# Patient Record
Sex: Male | Born: 1945 | Race: Black or African American | Hispanic: No | Marital: Single | Smoking: Former smoker
Health system: Southern US, Community
[De-identification: ages and names within clinical notes are randomized; demographics above are authoritative.]

## PROBLEM LIST (undated history)

## (undated) DIAGNOSIS — R7989 Other specified abnormal findings of blood chemistry: Secondary | ICD-10-CM

## (undated) DIAGNOSIS — R945 Abnormal results of liver function studies: Secondary | ICD-10-CM

## (undated) DIAGNOSIS — N529 Male erectile dysfunction, unspecified: Secondary | ICD-10-CM

## (undated) DIAGNOSIS — R011 Cardiac murmur, unspecified: Secondary | ICD-10-CM

## (undated) DIAGNOSIS — I10 Essential (primary) hypertension: Secondary | ICD-10-CM

## (undated) DIAGNOSIS — R7303 Prediabetes: Secondary | ICD-10-CM

## (undated) DIAGNOSIS — H9190 Unspecified hearing loss, unspecified ear: Secondary | ICD-10-CM

## (undated) DIAGNOSIS — H9193 Unspecified hearing loss, bilateral: Secondary | ICD-10-CM

## (undated) DIAGNOSIS — Z720 Tobacco use: Secondary | ICD-10-CM

## (undated) DIAGNOSIS — M1990 Unspecified osteoarthritis, unspecified site: Secondary | ICD-10-CM

## (undated) DIAGNOSIS — B182 Chronic viral hepatitis C: Secondary | ICD-10-CM

## (undated) HISTORY — DX: Cardiac murmur, unspecified: R01.1

## (undated) HISTORY — DX: Unspecified hearing loss, bilateral: H91.93

## (undated) HISTORY — DX: Abnormal results of liver function studies: R94.5

## (undated) HISTORY — DX: Prediabetes: R73.03

## (undated) HISTORY — DX: Male erectile dysfunction, unspecified: N52.9

## (undated) HISTORY — DX: Other specified abnormal findings of blood chemistry: R79.89

## (undated) HISTORY — PX: MANDIBLE SURGERY: SHX707

## (undated) HISTORY — DX: Unspecified hearing loss, unspecified ear: H91.90

## (undated) HISTORY — DX: Tobacco use: Z72.0

## (undated) HISTORY — DX: Chronic viral hepatitis C: B18.2

## (undated) HISTORY — DX: Unspecified osteoarthritis, unspecified site: M19.90

## (undated) HISTORY — DX: Essential (primary) hypertension: I10

---

## 2009-08-10 DEATH — deceased

## 2013-04-22 DIAGNOSIS — M064 Inflammatory polyarthropathy: Secondary | ICD-10-CM | POA: Diagnosis not present

## 2013-04-22 DIAGNOSIS — N529 Male erectile dysfunction, unspecified: Secondary | ICD-10-CM | POA: Diagnosis not present

## 2013-04-22 DIAGNOSIS — F17200 Nicotine dependence, unspecified, uncomplicated: Secondary | ICD-10-CM | POA: Diagnosis not present

## 2013-04-22 DIAGNOSIS — Z7189 Other specified counseling: Secondary | ICD-10-CM | POA: Diagnosis not present

## 2013-05-18 DIAGNOSIS — N529 Male erectile dysfunction, unspecified: Secondary | ICD-10-CM | POA: Diagnosis not present

## 2013-05-18 DIAGNOSIS — M064 Inflammatory polyarthropathy: Secondary | ICD-10-CM | POA: Diagnosis not present

## 2013-05-18 DIAGNOSIS — Z7189 Other specified counseling: Secondary | ICD-10-CM | POA: Diagnosis not present

## 2013-05-25 DIAGNOSIS — I10 Essential (primary) hypertension: Secondary | ICD-10-CM | POA: Diagnosis not present

## 2013-05-25 DIAGNOSIS — R7989 Other specified abnormal findings of blood chemistry: Secondary | ICD-10-CM | POA: Diagnosis not present

## 2013-05-25 DIAGNOSIS — R7309 Other abnormal glucose: Secondary | ICD-10-CM | POA: Diagnosis not present

## 2013-05-25 DIAGNOSIS — F17200 Nicotine dependence, unspecified, uncomplicated: Secondary | ICD-10-CM | POA: Diagnosis not present

## 2013-06-24 DIAGNOSIS — Z23 Encounter for immunization: Secondary | ICD-10-CM | POA: Diagnosis not present

## 2013-06-24 DIAGNOSIS — R7989 Other specified abnormal findings of blood chemistry: Secondary | ICD-10-CM | POA: Diagnosis not present

## 2013-06-24 DIAGNOSIS — I10 Essential (primary) hypertension: Secondary | ICD-10-CM | POA: Diagnosis not present

## 2013-06-24 DIAGNOSIS — E875 Hyperkalemia: Secondary | ICD-10-CM | POA: Diagnosis not present

## 2013-07-16 DIAGNOSIS — E875 Hyperkalemia: Secondary | ICD-10-CM | POA: Diagnosis not present

## 2013-07-16 DIAGNOSIS — R7989 Other specified abnormal findings of blood chemistry: Secondary | ICD-10-CM | POA: Diagnosis not present

## 2013-08-13 DIAGNOSIS — B182 Chronic viral hepatitis C: Secondary | ICD-10-CM | POA: Diagnosis not present

## 2013-09-07 DIAGNOSIS — B182 Chronic viral hepatitis C: Secondary | ICD-10-CM | POA: Diagnosis not present

## 2014-01-14 ENCOUNTER — Emergency Department: Payer: Self-pay | Admitting: Emergency Medicine

## 2014-01-14 DIAGNOSIS — B1920 Unspecified viral hepatitis C without hepatic coma: Secondary | ICD-10-CM | POA: Diagnosis not present

## 2014-01-14 DIAGNOSIS — Z79899 Other long term (current) drug therapy: Secondary | ICD-10-CM | POA: Diagnosis not present

## 2014-01-14 DIAGNOSIS — F17200 Nicotine dependence, unspecified, uncomplicated: Secondary | ICD-10-CM | POA: Diagnosis not present

## 2014-01-14 DIAGNOSIS — J42 Unspecified chronic bronchitis: Secondary | ICD-10-CM | POA: Diagnosis not present

## 2014-01-14 DIAGNOSIS — J438 Other emphysema: Secondary | ICD-10-CM | POA: Diagnosis not present

## 2014-01-14 DIAGNOSIS — J209 Acute bronchitis, unspecified: Secondary | ICD-10-CM | POA: Diagnosis not present

## 2014-01-14 DIAGNOSIS — I10 Essential (primary) hypertension: Secondary | ICD-10-CM | POA: Diagnosis not present

## 2014-01-14 LAB — COMPREHENSIVE METABOLIC PANEL
ALBUMIN: 3.2 g/dL — AB (ref 3.4–5.0)
ALK PHOS: 95 U/L
ALT: 185 U/L — AB (ref 12–78)
AST: 221 U/L — AB (ref 15–37)
Anion Gap: 3 — ABNORMAL LOW (ref 7–16)
BILIRUBIN TOTAL: 0.4 mg/dL (ref 0.2–1.0)
BUN: 8 mg/dL (ref 7–18)
CALCIUM: 8.7 mg/dL (ref 8.5–10.1)
CREATININE: 0.82 mg/dL (ref 0.60–1.30)
Chloride: 106 mmol/L (ref 98–107)
Co2: 30 mmol/L (ref 21–32)
EGFR (African American): >60
EGFR (Non-African Amer.): >60
GLUCOSE: 88 mg/dL (ref 65–99)
OSMOLALITY: 275 (ref 275–301)
POTASSIUM: 4.5 mmol/L (ref 3.5–5.1)
SODIUM: 139 mmol/L (ref 136–145)
TOTAL PROTEIN: 8.8 g/dL — AB (ref 6.4–8.2)

## 2014-01-14 LAB — CBC
HCT: 45.8 % (ref 40.0–52.0)
HGB: 15 g/dL (ref 13.0–18.0)
MCH: 30 pg (ref 26.0–34.0)
MCHC: 32.6 g/dL (ref 32.0–36.0)
MCV: 92 fL (ref 80–100)
Platelet: 152 10*3/uL (ref 150–440)
RBC: 4.99 10*6/uL (ref 4.40–5.90)
RDW: 17.8 % — ABNORMAL HIGH (ref 11.5–14.5)
WBC: 7 10*3/uL (ref 3.8–10.6)

## 2014-11-12 DIAGNOSIS — Z9181 History of falling: Secondary | ICD-10-CM | POA: Diagnosis not present

## 2014-11-12 DIAGNOSIS — R011 Cardiac murmur, unspecified: Secondary | ICD-10-CM | POA: Diagnosis not present

## 2014-11-12 DIAGNOSIS — B182 Chronic viral hepatitis C: Secondary | ICD-10-CM | POA: Diagnosis not present

## 2014-11-12 DIAGNOSIS — M1990 Unspecified osteoarthritis, unspecified site: Secondary | ICD-10-CM | POA: Diagnosis not present

## 2014-11-12 DIAGNOSIS — E875 Hyperkalemia: Secondary | ICD-10-CM | POA: Diagnosis not present

## 2014-11-12 DIAGNOSIS — H9193 Unspecified hearing loss, bilateral: Secondary | ICD-10-CM | POA: Diagnosis not present

## 2014-11-12 DIAGNOSIS — I10 Essential (primary) hypertension: Secondary | ICD-10-CM | POA: Diagnosis not present

## 2014-11-12 DIAGNOSIS — R6889 Other general symptoms and signs: Secondary | ICD-10-CM | POA: Diagnosis not present

## 2014-11-23 ENCOUNTER — Other Ambulatory Visit: Payer: Self-pay | Admitting: Family Medicine

## 2014-11-23 DIAGNOSIS — E875 Hyperkalemia: Secondary | ICD-10-CM | POA: Diagnosis not present

## 2014-12-15 ENCOUNTER — Ambulatory Visit
Admit: 2014-12-15 | Disposition: A | Discharge status: Home / Self Care | Payer: Self-pay | Attending: Family Medicine | Admitting: Family Medicine

## 2014-12-15 DIAGNOSIS — R05 Cough: Secondary | ICD-10-CM | POA: Diagnosis not present

## 2014-12-15 DIAGNOSIS — R011 Cardiac murmur, unspecified: Secondary | ICD-10-CM | POA: Diagnosis not present

## 2014-12-15 DIAGNOSIS — I10 Essential (primary) hypertension: Secondary | ICD-10-CM | POA: Diagnosis not present

## 2014-12-15 DIAGNOSIS — B182 Chronic viral hepatitis C: Secondary | ICD-10-CM | POA: Diagnosis not present

## 2014-12-22 ENCOUNTER — Ambulatory Visit
Admit: 2014-12-22 | Disposition: A | Discharge status: Home / Self Care | Payer: Self-pay | Attending: Gastroenterology | Admitting: Gastroenterology

## 2014-12-22 DIAGNOSIS — B182 Chronic viral hepatitis C: Secondary | ICD-10-CM | POA: Diagnosis not present

## 2014-12-22 LAB — CBC WITH DIFFERENTIAL/PLATELET
Basophil #: 0.1 10*3/uL (ref 0.0–0.1)
Basophil %: 1 %
EOS ABS: 0.1 10*3/uL (ref 0.0–0.7)
EOS PCT: 2.2 %
HCT: 49.4 % (ref 40.0–52.0)
HGB: 16.3 g/dL (ref 13.0–18.0)
LYMPHS ABS: 2.3 10*3/uL (ref 1.0–3.6)
Lymphocyte %: 35.3 %
MCH: 29.9 pg (ref 26.0–34.0)
MCHC: 32.9 g/dL (ref 32.0–36.0)
MCV: 91 fL (ref 80–100)
MONO ABS: 0.8 x10 3/mm (ref 0.2–1.0)
Monocyte %: 12.1 %
NEUTROS PCT: 49.4 %
Neutrophil #: 3.2 10*3/uL (ref 1.4–6.5)
PLATELETS: 152 10*3/uL (ref 150–440)
RBC: 5.44 10*6/uL (ref 4.40–5.90)
RDW: 17.3 % — ABNORMAL HIGH (ref 11.5–14.5)
WBC: 6.6 10*3/uL (ref 3.8–10.6)

## 2014-12-22 LAB — HEPATIC FUNCTION PANEL A (ARMC)
Albumin: 4 g/dL
Alkaline Phosphatase: 93 U/L
Bilirubin, Direct: 0.3 mg/dL
Bilirubin,Total: 1.1 mg/dL
Indirect Bilirubin: 0.8
SGOT(AST): 163 U/L — ABNORMAL HIGH
SGPT (ALT): 166 U/L — ABNORMAL HIGH
Total Protein: 9.3 g/dL — ABNORMAL HIGH

## 2014-12-28 ENCOUNTER — Ambulatory Visit
Admit: 2014-12-28 | Disposition: A | Discharge status: Home / Self Care | Payer: Self-pay | Attending: Gastroenterology | Admitting: Gastroenterology

## 2014-12-28 DIAGNOSIS — B182 Chronic viral hepatitis C: Secondary | ICD-10-CM | POA: Diagnosis not present

## 2014-12-28 DIAGNOSIS — B1920 Unspecified viral hepatitis C without hepatic coma: Secondary | ICD-10-CM | POA: Diagnosis not present

## 2014-12-28 DIAGNOSIS — R932 Abnormal findings on diagnostic imaging of liver and biliary tract: Secondary | ICD-10-CM | POA: Diagnosis not present

## 2015-01-18 DIAGNOSIS — R7309 Other abnormal glucose: Secondary | ICD-10-CM | POA: Diagnosis not present

## 2015-01-18 DIAGNOSIS — I10 Essential (primary) hypertension: Secondary | ICD-10-CM | POA: Diagnosis not present

## 2015-01-18 DIAGNOSIS — Z1211 Encounter for screening for malignant neoplasm of colon: Secondary | ICD-10-CM | POA: Diagnosis not present

## 2015-01-18 DIAGNOSIS — R011 Cardiac murmur, unspecified: Secondary | ICD-10-CM | POA: Diagnosis not present

## 2015-01-18 DIAGNOSIS — Z720 Tobacco use: Secondary | ICD-10-CM | POA: Diagnosis not present

## 2015-01-18 DIAGNOSIS — R202 Paresthesia of skin: Secondary | ICD-10-CM | POA: Diagnosis not present

## 2015-01-18 DIAGNOSIS — B182 Chronic viral hepatitis C: Secondary | ICD-10-CM | POA: Diagnosis not present

## 2015-01-18 DIAGNOSIS — Z9181 History of falling: Secondary | ICD-10-CM | POA: Diagnosis not present

## 2015-01-20 ENCOUNTER — Other Ambulatory Visit
Admission: RE | Admit: 2015-01-20 | Discharge: 2015-01-20 | Disposition: A | Discharge status: Home / Self Care | Payer: Medicare Other | Source: Ambulatory Visit | Attending: Family Medicine | Admitting: Family Medicine

## 2015-01-20 DIAGNOSIS — R202 Paresthesia of skin: Secondary | ICD-10-CM | POA: Diagnosis not present

## 2015-01-20 DIAGNOSIS — R200 Anesthesia of skin: Secondary | ICD-10-CM | POA: Insufficient documentation

## 2015-01-20 LAB — FOLATE: Folate: 12.6 ng/mL (ref >5.9)

## 2015-01-20 LAB — TSH: TSH: 1.445 u[IU]/mL (ref 0.350–4.500)

## 2015-01-20 LAB — VITAMIN B12: Vitamin B-12: 421 pg/mL (ref 180–914)

## 2015-02-16 ENCOUNTER — Encounter: Payer: Self-pay | Admitting: *Deleted

## 2015-02-16 ENCOUNTER — Encounter: Payer: Self-pay | Admitting: Cardiology

## 2015-02-16 NOTE — Progress Notes (Deleted)
   PATIENT: Roy Roberson MRN: [id number] DOB: 03/21/1946 PCP: Fidel LevyJames Hawkins Jr, MD  Clinic Note: No chief complaint on file.   HPI: Roy Roberson is a 69 y.o. male with a PMH below who presents today for ***.  Interval History: ***  The remainder of her Cardiovascular ROS is as follows: {roscv:310661} :   Past Medical History  Diagnosis Date  . Hypertension   . Decreased hearing of both ears   . Inflammatory arthritis   . Prediabetes   . Tobacco abuse   . Cardiac murmur   . ED (erectile dysfunction)   . Elevated liver function tests   . Chronic hepatitis C without hepatic coma     Prior Cardiac Evaluation and Past Surgical History: Past Surgical History  Procedure Laterality Date  . Mandible surgery      No Known Allergies  Current Outpatient Prescriptions  Medication Sig Dispense Refill  . hydrochlorothiazide (MICROZIDE) 12.5 MG capsule Take 12.5 mg by mouth daily.    . Ledipasvir-Sofosbuvir 90-400 MG TABS Take by mouth daily.    Marland Kitchen. losartan (COZAAR) 50 MG tablet Take 50 mg by mouth daily.     No current facility-administered medications for this visit.    History  Substance Use Topics  . Smoking status: Current Every Day Smoker -- 1.00 packs/day for 30 years  . Smokeless tobacco: Not on file     Comment: currently smokes 1/2 PPD.   Marland Kitchen. Alcohol Use: Yes     Comment: 12 pack over the weekend.      family history is not on file.  ROS: A comprehensive Review of Systems -  ROS   PHYSICAL EXAM There were no vitals taken for this visit. {Physical WUJW:1191478}Exam:[id number]}   Adult ECG Report  Rate: *** ;  Rhythm: {rhythm:17366}  QRS Axis: *** ;  PR Interval: *** ;  QRS Duration: *** ; QTc: ***  Voltages: ***  Conduction Disturbances: {conduction defects:17367}  Other Abnormalities: {findings; ecg:17365}   Narrative Interpretation: ***  Recent Labs: ***  ASSESSMENT / PLAN: ***  Problem List Items Addressed This Visit    None      No orders of the  defined types were placed in this encounter.    Followup: *** months  DAVID W. Herbie BaltimoreHARDING, M.D., M.S. Interventional Cardiolgy [facility]    This encounter was created in error - please disregard.

## 2015-02-18 NOTE — Progress Notes (Signed)
No Show

## 2015-04-04 ENCOUNTER — Encounter: Payer: Self-pay | Admitting: Gastroenterology

## 2015-04-04 ENCOUNTER — Telehealth: Payer: Self-pay | Admitting: Gastroenterology

## 2015-04-04 NOTE — Telephone Encounter (Signed)
Patient called and said he has 4 Harvoni pills left. Is he cured or should he get more medication? Please call. Patient said he does not have an answering machine but a red light will blink when he has a phone call.

## 2015-04-04 NOTE — Telephone Encounter (Signed)
Advised pt that Dr. Servando Snare requires an office visit once he completes his treatment. Told pt we will order his labs during this visit. Pt scheduled with Dr. Servando Snare on 05-04-15 in [place].

## 2015-04-15 ENCOUNTER — Ambulatory Visit (INDEPENDENT_AMBULATORY_CARE_PROVIDER_SITE_OTHER): Payer: Medicare Other | Admitting: Family Medicine

## 2015-04-15 ENCOUNTER — Encounter: Payer: Self-pay | Admitting: Family Medicine

## 2015-04-15 VITALS — BP 120/60 | HR 84 | Temp 97.5°F | Resp 16 | Ht 68.0 in | Wt 179.4 lb

## 2015-04-15 DIAGNOSIS — B182 Chronic viral hepatitis C: Secondary | ICD-10-CM | POA: Diagnosis not present

## 2015-04-15 DIAGNOSIS — R011 Cardiac murmur, unspecified: Secondary | ICD-10-CM | POA: Diagnosis not present

## 2015-04-15 DIAGNOSIS — R053 Chronic cough: Secondary | ICD-10-CM | POA: Insufficient documentation

## 2015-04-15 DIAGNOSIS — F17210 Nicotine dependence, cigarettes, uncomplicated: Secondary | ICD-10-CM | POA: Diagnosis not present

## 2015-04-15 DIAGNOSIS — I10 Essential (primary) hypertension: Secondary | ICD-10-CM | POA: Diagnosis not present

## 2015-04-15 DIAGNOSIS — R945 Abnormal results of liver function studies: Secondary | ICD-10-CM | POA: Insufficient documentation

## 2015-04-15 DIAGNOSIS — R05 Cough: Secondary | ICD-10-CM | POA: Insufficient documentation

## 2015-04-15 DIAGNOSIS — E875 Hyperkalemia: Secondary | ICD-10-CM | POA: Insufficient documentation

## 2015-04-15 DIAGNOSIS — R7989 Other specified abnormal findings of blood chemistry: Secondary | ICD-10-CM | POA: Insufficient documentation

## 2015-04-15 DIAGNOSIS — H9190 Unspecified hearing loss, unspecified ear: Secondary | ICD-10-CM | POA: Insufficient documentation

## 2015-04-15 DIAGNOSIS — R202 Paresthesia of skin: Secondary | ICD-10-CM | POA: Insufficient documentation

## 2015-04-15 DIAGNOSIS — Z1211 Encounter for screening for malignant neoplasm of colon: Secondary | ICD-10-CM | POA: Insufficient documentation

## 2015-04-15 DIAGNOSIS — N529 Male erectile dysfunction, unspecified: Secondary | ICD-10-CM | POA: Insufficient documentation

## 2015-04-15 DIAGNOSIS — Z709 Sex counseling, unspecified: Secondary | ICD-10-CM | POA: Insufficient documentation

## 2015-04-15 DIAGNOSIS — Z8619 Personal history of other infectious and parasitic diseases: Secondary | ICD-10-CM | POA: Insufficient documentation

## 2015-04-15 DIAGNOSIS — M180 Bilateral primary osteoarthritis of first carpometacarpal joints: Secondary | ICD-10-CM | POA: Insufficient documentation

## 2015-04-15 NOTE — Patient Instructions (Signed)
Continue to f/u with Dr. Servando Snare re: Hep C.  Continue current medication.  Appt. For Cardiology in 1-2 months.

## 2015-04-15 NOTE — Progress Notes (Signed)
Name: Roy Roberson   MRN: [id number]    DOB: Oct 15, 1945   Date:04/15/2015       Progress Note  Subjective  Chief Complaint  Chief Complaint  Patient presents with  . Hypertension    HPI  Here for f/u of HBP.  Still smoking.  Has been treated for chronic Hepatitis C.  Taking 2 BP meds.  Feeling well. No problem-specific assessment & plan notes found for this encounter.   Past Medical History  Diagnosis Date  . Hypertension   . Decreased hearing of both ears   . Inflammatory arthritis   . Prediabetes   . Tobacco abuse   . Cardiac murmur   . ED (erectile dysfunction)   . Elevated liver function tests   . Chronic hepatitis C without hepatic coma   . Decreased hearing     History  Substance Use Topics  . Smoking status: Current Every Day Smoker -- 1.00 packs/day for 30 years  . Smokeless tobacco: Never Used     Comment: currently smokes 1/2 PPD.   Marland Kitchen Alcohol Use: Yes     Comment: 12 pack over the weekend.      Current outpatient prescriptions:  .  hydrochlorothiazide (MICROZIDE) 12.5 MG capsule, Take 12.5 mg by mouth daily., Disp: , Rfl:  .  losartan (COZAAR) 50 MG tablet, Take 50 mg by mouth daily., Disp: , Rfl:   No Known Allergies  Review of Systems  Constitutional: Negative for fever, chills, weight loss and malaise/fatigue.  HENT: Negative for hearing loss.   Eyes: Negative for blurred vision and double vision.  Respiratory: Negative for cough, sputum production, shortness of breath and wheezing.   Cardiovascular: Negative for chest pain, palpitations, orthopnea and leg swelling.  Gastrointestinal: Negative for heartburn, nausea, vomiting, abdominal pain, diarrhea and blood in stool.  Genitourinary: Negative for dysuria, urgency and frequency.  Musculoskeletal: Negative for myalgias and joint pain.  Skin: Negative for rash.  Neurological: Negative for dizziness, tingling, sensory change, focal weakness, weakness and headaches.      Objective  Filed  Vitals:   04/15/15 1038  BP: 100/63  Pulse: 84  Temp: 97.5 F (36.4 C)  Resp: 16  Height:  (1.727 m)  Weight: 179 lb 6.4 oz (81.375 kg)     Physical Exam  Constitutional: He is well-developed, well-nourished, and in no distress.  HENT:  Head: Normocephalic and atraumatic.  Eyes: Conjunctivae and EOM are normal. Pupils are equal, round, and reactive to light.  Neck: Normal range of motion. Neck supple. No thyromegaly present.  Cardiovascular: Normal rate and regular rhythm.  Exam reveals no gallop and no friction rub.   Murmur heard.  Systolic murmur is present with a grade of 3/6  Murmur heard throughout.  Pulmonary/Chest: Effort normal. No respiratory distress. He has decreased breath sounds in the right upper field, the right middle field, the right lower field, the left upper field, the left middle field and the left lower field. He has no wheezes. He has no rales.  Abdominal: Soft. Bowel sounds are normal. He exhibits no distension and no mass. There is no tenderness.  Musculoskeletal: He exhibits no edema.  Lymphadenopathy:    He has no cervical adenopathy.  Vitals reviewed.     Recent Results (from the past 2160 hour(s))  Vitamin B12     Status: None   Collection Time: 01/20/15  9:38 AM  Result Value Ref Range   Vitamin B-12 421 180 - 914 pg/mL  Comment: (NOTE) This assay is not validated for testing neonatal or myeloproliferative syndrome specimens for Vitamin B12 levels. Performed at [facility]   Folate     Status: None   Collection Time: 01/20/15  9:38 AM  Result Value Ref Range   Folate 12.6 >5.9 ng/mL  TSH     Status: None   Collection Time: 01/20/15  9:38 AM  Result Value Ref Range   TSH 1.445 0.350 - 4.500 uIU/mL     Assessment & Plan  1. Smoking 1/2 pack a day or less    2. Essential (primary) hypertension    3. Chronic hepatitis C without hepatic coma    4. Cardiac murmur  - Ambulatory referral to  Cardiology

## 2015-04-21 ENCOUNTER — Encounter: Payer: Self-pay | Admitting: *Deleted

## 2015-05-04 ENCOUNTER — Other Ambulatory Visit
Admission: RE | Admit: 2015-05-04 | Discharge: 2015-05-04 | Disposition: A | Discharge status: Home / Self Care | Payer: Medicare Other | Source: Ambulatory Visit | Attending: Gastroenterology | Admitting: Gastroenterology

## 2015-05-04 ENCOUNTER — Ambulatory Visit (INDEPENDENT_AMBULATORY_CARE_PROVIDER_SITE_OTHER): Payer: Medicare Other | Admitting: Gastroenterology

## 2015-05-04 ENCOUNTER — Encounter: Payer: Self-pay | Admitting: Gastroenterology

## 2015-05-04 VITALS — BP 105/66 | HR 83 | Temp 98.7°F | Ht 66.0 in | Wt 180.0 lb

## 2015-05-04 DIAGNOSIS — B182 Chronic viral hepatitis C: Secondary | ICD-10-CM

## 2015-05-04 LAB — HEPATIC FUNCTION PANEL
ALT: 27 U/L (ref 17–63)
AST: 35 U/L (ref 15–41)
Albumin: 3.8 g/dL (ref 3.5–5.0)
Alkaline Phosphatase: 73 U/L (ref 38–126)
Total Bilirubin: 0.4 mg/dL (ref 0.3–1.2)
Total Protein: 8.8 g/dL — ABNORMAL HIGH (ref 6.5–8.1)

## 2015-05-04 NOTE — Progress Notes (Signed)
   Primary Care Physician: Fidel Levy, MD  Primary Gastroenterologist:  Dr. Midge Minium  Chief Complaint  Patient presents with  . Follow up Hepatitis C    Treatment completed    HPI: Roy Roberson is a 69 y.o. male here for follow-up of his hepatitis C. The patient reports that he completed his treatment about a month ago. He reports that he did not have any side effects from the treatment. He has been doing well since stopping the medication. His fibrosis score was F0/F1 on imaging.    Current Outpatient Prescriptions  Medication Sig Dispense Refill  . hydrochlorothiazide (MICROZIDE) 12.5 MG capsule Take 12.5 mg by mouth daily.    Marland Kitchen losartan (COZAAR) 50 MG tablet Take 50 mg by mouth daily.     No current facility-administered medications for this visit.    Allergies as of 05/04/2015  . (No Known Allergies)    ROS:  General: Negative for anorexia, weight loss, fever, chills, fatigue, weakness. ENT: Negative for hoarseness, difficulty swallowing , nasal congestion. CV: Negative for chest pain, angina, palpitations, dyspnea on exertion, peripheral edema.  Respiratory: Negative for dyspnea at rest, dyspnea on exertion, cough, sputum, wheezing.  GI: See history of present illness. GU:  Negative for dysuria, hematuria, urinary incontinence, urinary frequency, nocturnal urination.  Endo: Negative for unusual weight change.    Physical Examination:   BP 105/66 mmHg  Pulse 83  Temp(Src) 98.7 F (37.1 C) (Oral)  Ht  (1.676 m)  Wt 180 lb (81.647 kg)  BMI 29.07 kg/m2  General: Well-nourished, well-developed in no acute distress.  Eyes: No icterus. Conjunctivae pink. Mouth: Oropharyngeal mucosa moist and pink , no lesions erythema or exudate. Lungs: Clear to auscultation bilaterally. Non-labored. Heart: Regular rate and rhythm, no murmurs rubs or gallops.  Abdomen: Bowel sounds are normal, nontender, nondistended, no hepatosplenomegaly or masses, no abdominal  bruits or hernia , no rebound or guarding.   Extremities: No lower extremity edema. No clubbing or deformities. Neuro: Alert and oriented x 3.  Grossly intact. Skin: Warm and dry, no jaundice.   Psych: Alert and cooperative, normal mood and affect.  Labs:    Imaging Studies: No results found.  Assessment and Plan:   Roy Roberson is a 69 y.o. y/o male who comes for follow-up after completing his hepatitis C treatment. The patient will have his last sent off for hepatitis C viral load and for the refraction test. The patient will be contacted with the results.   Note: This dictation was prepared with Dragon dictation along with smaller phrase technology. Any transcriptional errors that result from this process are unintentional.

## 2015-05-05 LAB — HCV RNA QUANT RFLX ULTRA OR GENOTYP
HCV RNA QNT(LOG COPY/ML): UNDETERMINED {Log_IU}/mL
HEPATITIS C QUANTITATION: NOT DETECTED [IU]/mL

## 2015-05-09 ENCOUNTER — Telehealth: Payer: Self-pay

## 2015-05-09 NOTE — Telephone Encounter (Signed)
LVM for pt to return my call.

## 2015-05-09 NOTE — Telephone Encounter (Signed)
-----   Message from Darren Wohl, MD sent at 05/09/2015  1:05 PM EDT ----- Let the patient know that his liver enzymes are normal and his hepatitis C count was 0. Repeat labs that 6 months and one year after he completed his treatment. 

## 2015-05-10 NOTE — Telephone Encounter (Signed)
-----   Message from Midge Minium, MD sent at 05/09/2015  1:05 PM EDT ----- Let the patient know that his liver enzymes are normal and his hepatitis C count was 0. Repeat labs that 6 months and one year after he completed his treatment.

## 2015-05-10 NOTE — Telephone Encounter (Signed)
Pt notified of lab results. Pt added to recall list for 6 months repeat of viral load.

## 2015-05-31 NOTE — Telephone Encounter (Signed)
error 

## 2015-06-15 ENCOUNTER — Encounter: Payer: Self-pay | Admitting: Cardiovascular Disease

## 2015-06-15 ENCOUNTER — Ambulatory Visit (INDEPENDENT_AMBULATORY_CARE_PROVIDER_SITE_OTHER): Payer: Medicare Other | Admitting: Cardiovascular Disease

## 2015-06-15 VITALS — BP 110/62 | HR 86 | Ht 64.0 in | Wt 181.5 lb

## 2015-06-15 DIAGNOSIS — F1010 Alcohol abuse, uncomplicated: Secondary | ICD-10-CM

## 2015-06-15 DIAGNOSIS — I10 Essential (primary) hypertension: Secondary | ICD-10-CM

## 2015-06-15 DIAGNOSIS — B182 Chronic viral hepatitis C: Secondary | ICD-10-CM

## 2015-06-15 DIAGNOSIS — R011 Cardiac murmur, unspecified: Secondary | ICD-10-CM | POA: Diagnosis not present

## 2015-06-15 NOTE — Assessment & Plan Note (Signed)
Very prominent murmur diffusely throughout his axillary region on the left to right upper sternal border even up to his carotids. Most notable across the left of the sternum concerning for possible VSD though unable to exclude tricuspid valve regurgitation or other etiology. Certainly needs an echocardiogram to determine the etiology of his murmur. One will be ordered for him. This was discussed with him and he is in agreement

## 2015-06-15 NOTE — Assessment & Plan Note (Signed)
Blood pressure is well controlled on today's visit. No changes made to the medications. 

## 2015-06-15 NOTE — Assessment & Plan Note (Signed)
Managed by Dr. Servando Snare. Notes indicate he has had treatment

## 2015-06-15 NOTE — Progress Notes (Signed)
Patient ID: Roy Roberson, male    DOB: [birth year], 69 y.o.   MRN: [id number]  HPI Comments: Mr. Brandel is a 69 year old gentleman with long smoking history who continues to smoke 5 cigarettes per day, hepatitis C who has completed treatment under the guidance of Dr. Servando Snare, long alcohol history who presents for evaluation of murmur.  He denies any shortness of breath with exertion, no lower extremity edema, no PND or orthopnea. In general he feels well. He used to bike "because he had to" but does not bike as much anymore. He does not drive, relies on family members to write him to his appointments. He's never been told that he has a murmur before until recently Dr. Juanetta Gosling. Denies any chest pain on exertion. Reports that he did quit smoking for a period of time, but had a girlfriend that got him started again.  Recent neck pain, difficulty turning his head. Started taking more alcohol and this seemed to help his symptoms.  EKG on today's visit shows normal sinus rhythm with rate 86 bpm, no significant ST or T-wave changes    No Known Allergies  Current Outpatient Prescriptions on File Prior to Visit  Medication Sig Dispense Refill  . hydrochlorothiazide (MICROZIDE) 12.5 MG capsule Take 12.5 mg by mouth daily.    Marland Kitchen losartan (COZAAR) 50 MG tablet Take 50 mg by mouth daily.     No current facility-administered medications on file prior to visit.    Past Medical History  Diagnosis Date  . Hypertension   . Decreased hearing of both ears   . Inflammatory arthritis (HCC)   . Prediabetes   . Tobacco abuse   . Cardiac murmur   . ED (erectile dysfunction)   . Elevated liver function tests   . Chronic hepatitis C without hepatic coma (HCC)   . Decreased hearing     Past Surgical History  Procedure Laterality Date  . Mandible surgery      Social History  reports that he has been smoking.  He has never used smokeless tobacco. He reports that he drinks alcohol. He reports that he  does not use illicit drugs.  Family History family history includes Cancer in his mother; Diabetes in his mother.  Review of Systems  Constitutional: Negative.   Respiratory: Negative.   Cardiovascular: Negative.   Gastrointestinal: Negative.   Musculoskeletal: Negative.   Neurological: Negative.   Hematological: Negative.   Psychiatric/Behavioral: Negative.   All other systems reviewed and are negative.  BP 110/62 mmHg  Pulse 86  Ht  (1.626 m)  Wt 181 lb 8 oz (82.328 kg)  BMI 31.14 kg/m2  Physical Exam  Constitutional: He is oriented to person, place, and time. He appears well-developed and well-nourished.  HENT:  Head: Normocephalic.  Nose: Nose normal.  Mouth/Throat: Oropharynx is clear and moist.  Eyes: Conjunctivae are normal. Pupils are equal, round, and reactive to light.  Neck: Normal range of motion. Neck supple. No JVD present.  Cardiovascular: Normal rate, regular rhythm and intact distal pulses.  Exam reveals no gallop and no friction rub.   Murmur heard.  Systolic murmur is present with a grade of 2/6  Murmur appreciated throughout his left chest most notably left of the sternum at the third and fourth intercostal but radiating up to his carotids, into the left axilla.  Pulmonary/Chest: Effort normal and breath sounds normal. No respiratory distress. He has no wheezes. He has no rales. He exhibits no tenderness.  Abdominal: Soft. Bowel  sounds are normal. He exhibits no distension. There is no tenderness.  Musculoskeletal: Normal range of motion. He exhibits no edema or tenderness.  Lymphadenopathy:    He has no cervical adenopathy.  Neurological: He is alert and oriented to person, place, and time. Coordination normal.  Skin: Skin is warm and dry. No rash noted. No erythema.  Psychiatric: He has a normal mood and affect. His behavior is normal. Judgment and thought content normal.

## 2015-06-15 NOTE — Patient Instructions (Addendum)
You are doing well. No medication changes were made.  You have a murmur, We will schedule an echocardiogram to look at your heart   Please call us if you have new issues that need to be addressed before your next appt.     Echocardiogram An echocardiogram, or echocardiography, uses sound waves (ultrasound) to produce an image of your heart. The echocardiogram is simple, painless, obtained within a short period of time, and offers valuable information to your health care provider. The images from an echocardiogram can provide information such as:  Evidence of coronary artery disease (CAD).  Heart size.  Heart muscle function.  Heart valve function.  Aneurysm detection.  Evidence of a past heart attack.  Fluid buildup around the heart.  Heart muscle thickening.  Assess heart valve function. LET [facility] CARE PROVIDER KNOW ABOUT:  Any allergies you have.  All medicines you are taking, including vitamins, herbs, eye drops, creams, and over-the-counter medicines.  Previous problems you or members of your family have had with the use of anesthetics.  Any blood disorders you have.  Previous surgeries you have had.  Medical conditions you have.  Possibility of pregnancy, if this applies. BEFORE THE PROCEDURE  No special preparation is needed. Eat and drink normally.  PROCEDURE   In order to produce an image of your heart, gel will be applied to your chest and a wand-like tool (transducer) will be moved over your chest. The gel will help transmit the sound waves from the transducer. The sound waves will harmlessly bounce off your heart to allow the heart images to be captured in real-time motion. These images will then be recorded.  You may need an IV to receive a medicine that improves the quality of the pictures. AFTER THE PROCEDURE You may return to your normal schedule including diet, activities, and medicines, unless your health care provider tells you otherwise.   This information is not intended to replace advice given to you by your health care provider. Make sure you discuss any questions you have with your health care provider.   Document Released: 08/24/2000 Document Revised: 09/17/2014 Document Reviewed: 05/04/2013 Elsevier Interactive Patient Education [employer]

## 2015-06-15 NOTE — Assessment & Plan Note (Signed)
He reports long history of smoking, and coinciding with his alcohol. Recently started drinking more alcohol in the setting of neck pain. recommended alcohol cessation.

## 2015-07-11 ENCOUNTER — Other Ambulatory Visit: Payer: Self-pay | Admitting: Family Medicine

## 2015-07-15 ENCOUNTER — Other Ambulatory Visit: Payer: Medicare Other

## 2015-08-08 ENCOUNTER — Other Ambulatory Visit: Payer: Self-pay

## 2015-08-08 ENCOUNTER — Ambulatory Visit (INDEPENDENT_AMBULATORY_CARE_PROVIDER_SITE_OTHER): Payer: Medicare Other

## 2015-08-08 DIAGNOSIS — I10 Essential (primary) hypertension: Secondary | ICD-10-CM | POA: Diagnosis not present

## 2015-08-08 DIAGNOSIS — R011 Cardiac murmur, unspecified: Secondary | ICD-10-CM

## 2015-08-16 ENCOUNTER — Encounter: Payer: Self-pay | Admitting: Family Medicine

## 2015-08-16 ENCOUNTER — Ambulatory Visit (INDEPENDENT_AMBULATORY_CARE_PROVIDER_SITE_OTHER): Payer: Medicare Other | Admitting: Family Medicine

## 2015-08-16 VITALS — BP 125/77 | HR 76 | Resp 16 | Ht 64.0 in | Wt 185.2 lb

## 2015-08-16 DIAGNOSIS — R7989 Other specified abnormal findings of blood chemistry: Secondary | ICD-10-CM | POA: Diagnosis not present

## 2015-08-16 DIAGNOSIS — Z23 Encounter for immunization: Secondary | ICD-10-CM | POA: Diagnosis not present

## 2015-08-16 DIAGNOSIS — R945 Abnormal results of liver function studies: Secondary | ICD-10-CM

## 2015-08-16 DIAGNOSIS — I10 Essential (primary) hypertension: Secondary | ICD-10-CM | POA: Diagnosis not present

## 2015-08-16 DIAGNOSIS — F1010 Alcohol abuse, uncomplicated: Secondary | ICD-10-CM

## 2015-08-16 DIAGNOSIS — B182 Chronic viral hepatitis C: Secondary | ICD-10-CM

## 2015-08-16 MED ORDER — LOSARTAN POTASSIUM 50 MG PO TABS: 50.0000 mg | ORAL_TABLET | Freq: Every day | ORAL | Status: DC

## 2015-08-16 MED ORDER — HYDROCHLOROTHIAZIDE 12.5 MG PO CAPS: 12.5000 mg | ORAL_CAPSULE | Freq: Every day | ORAL | Status: DC

## 2015-08-16 NOTE — Progress Notes (Signed)
Name: Roy Roberson   MRN: [id number]    DOB: 1946/02/26   Date:08/16/2015       Progress Note  Subjective  Chief Complaint  Chief Complaint  Patient presents with  . Hypertension    HPI Here for f/u of HBP.  Still smoking and drinking fairly heavily on weekend.  Has seen Card.  No sig. Problems found.  No problem-specific assessment & plan notes found for this encounter.   Past Medical History  Diagnosis Date  . Hypertension   . Decreased hearing of both ears   . Inflammatory arthritis (HCC)   . Prediabetes   . Tobacco abuse   . Cardiac murmur   . ED (erectile dysfunction)   . Elevated liver function tests   . Chronic hepatitis C without hepatic coma (HCC)   . Decreased hearing     Past Surgical History  Procedure Laterality Date  . Mandible surgery      Family History  Problem Relation Age of Onset  . Cancer Mother   . Diabetes Mother     Social History   Social History  . Marital Status: Single    Spouse Name: N/A  . Number of Children: N/A  . Years of Education: N/A   Occupational History  . Not on file.   Social History Main Topics  . Smoking status: Current Every Day Smoker -- 0.25 packs/day for 30 years  . Smokeless tobacco: Never Used     Comment: currently smokes 1/2 PPD.   Marland Kitchen Alcohol Use: Yes     Comment: 12 pack over the weekend.   . Drug Use: No  . Sexual Activity: Not on file   Other Topics Concern  . Not on file   Social History Narrative     Current outpatient prescriptions:  .  hydrochlorothiazide (MICROZIDE) 12.5 MG capsule, Take 1 capsule (12.5 mg total) by mouth daily., Disp: 30 capsule, Rfl: 12 .  losartan (COZAAR) 50 MG tablet, Take 1 tablet (50 mg total) by mouth daily., Disp: 30 tablet, Rfl: 12  Not on File   Review of Systems  Constitutional: Negative for fever, chills, weight loss and malaise/fatigue.  HENT: Negative for hearing loss.   Eyes: Positive for double vision. Negative for blurred vision.  Respiratory:  Negative for cough, shortness of breath and wheezing.   Cardiovascular: Negative for chest pain, palpitations and leg swelling.  Gastrointestinal: Negative for heartburn, abdominal pain and blood in stool.  Genitourinary: Negative for dysuria, urgency and frequency.  Musculoskeletal: Positive for joint pain (shoulder ) and neck pain. Negative for myalgias.  Skin: Negative for rash.  Neurological: Negative for dizziness, tremors, weakness and headaches.      Objective  Filed Vitals:   08/16/15 0959  BP: 125/77  Pulse: 76  Resp: 16  Height:  (1.626 m)  Weight: 185 lb 3.2 oz (84.006 kg)    Physical Exam  Constitutional: He is oriented to person, place, and time and well-developed, well-nourished, and in no distress. No distress.  HENT:  Head: Normocephalic.  Eyes: Conjunctivae and EOM are normal. Pupils are equal, round, and reactive to light. No scleral icterus.  Neck: Normal range of motion. Neck supple. Carotid bruit is not present. No thyromegaly present.  Cardiovascular: Normal rate, regular rhythm and intact distal pulses.  Exam reveals no gallop and no friction rub.   Murmur heard.  Systolic murmur is present with a grade of 3/6  throughout  Pulmonary/Chest: Effort normal and breath sounds normal. No  respiratory distress. He has no wheezes. He has no rales.  Abdominal: Soft. Bowel sounds are normal. He exhibits no distension, no abdominal bruit and no mass. There is no tenderness.  Musculoskeletal: Normal range of motion. He exhibits no edema.  Lymphadenopathy:    He has no cervical adenopathy.  Neurological: He is alert and oriented to person, place, and time.  Vitals reviewed.      No results found for this or any previous visit (from the past 2160 hour(s)).   Assessment & Plan  Problem List Items Addressed This Visit    None    Visit Diagnoses    Need for influenza vaccination    -  Primary    Relevant Orders    Flu vaccine HIGH DOSE PF (Fluzone High  dose)       Meds ordered this encounter  Medications  . losartan (COZAAR) 50 MG tablet    Sig: Take 1 tablet (50 mg total) by mouth daily.    Dispense:  30 tablet    Refill:  12  . hydrochlorothiazide (MICROZIDE) 12.5 MG capsule    Sig: Take 1 capsule (12.5 mg total) by mouth daily.    Dispense:  30 capsule    Refill:  12   1. Need for influenza vaccination  - Flu vaccine HIGH DOSE PF (Fluzone High dose)  2. Essential (primary) hypertension  - Comprehensive Metabolic Panel (CMET) - Lipid Profile  3. Chronic hepatitis C without hepatic coma (HCC)   4. Abnormal LFTs   5. ETOH abuse  - CBC with Differential

## 2015-10-11 ENCOUNTER — Other Ambulatory Visit
Admission: RE | Admit: 2015-10-11 | Discharge: 2015-10-11 | Disposition: A | Discharge status: Home / Self Care | Payer: Medicare Other | Source: Ambulatory Visit | Attending: Family Medicine | Admitting: Family Medicine

## 2015-10-11 DIAGNOSIS — I10 Essential (primary) hypertension: Secondary | ICD-10-CM | POA: Diagnosis present

## 2015-10-11 DIAGNOSIS — F1010 Alcohol abuse, uncomplicated: Secondary | ICD-10-CM | POA: Diagnosis not present

## 2015-10-11 LAB — COMPREHENSIVE METABOLIC PANEL
ALBUMIN: 3.7 g/dL (ref 3.5–5.0)
ALK PHOS: 69 U/L (ref 38–126)
ALT: 32 U/L (ref 17–63)
AST: 39 U/L (ref 15–41)
Anion gap: 6 (ref 5–15)
BILIRUBIN TOTAL: 0.5 mg/dL (ref 0.3–1.2)
BUN: 11 mg/dL (ref 6–20)
CO2: 28 mmol/L (ref 22–32)
Calcium: 8.9 mg/dL (ref 8.9–10.3)
Chloride: 106 mmol/L (ref 101–111)
Creatinine, Ser: 0.91 mg/dL (ref 0.61–1.24)
GFR calc Af Amer: >60 mL/min (ref >60)
GFR calc non Af Amer: >60 mL/min (ref >60)
GLUCOSE: 102 mg/dL — AB (ref 65–99)
Potassium: 3.8 mmol/L (ref 3.5–5.1)
Sodium: 140 mmol/L (ref 135–145)
TOTAL PROTEIN: 8.3 g/dL — AB (ref 6.5–8.1)

## 2015-10-11 LAB — LIPID PANEL
Cholesterol: 150 mg/dL (ref 0–200)
HDL: 40 mg/dL — AB (ref >40)
LDL CALC: 81 mg/dL (ref 0–99)
Total CHOL/HDL Ratio: 3.8 RATIO
Triglycerides: 145 mg/dL (ref <150)
VLDL: 29 mg/dL (ref 0–40)

## 2015-10-11 LAB — CBC WITH DIFFERENTIAL/PLATELET
BASOS ABS: 0 10*3/uL (ref 0–0.1)
Basophils Relative: 1 %
EOS PCT: 4 %
Eosinophils Absolute: 0.2 10*3/uL (ref 0–0.7)
HCT: 43.1 % (ref 40.0–52.0)
Hemoglobin: 14.2 g/dL (ref 13.0–18.0)
LYMPHS PCT: 34 %
Lymphs Abs: 1.8 10*3/uL (ref 1.0–3.6)
MCH: 29.7 pg (ref 26.0–34.0)
MCHC: 32.8 g/dL (ref 32.0–36.0)
MCV: 90.5 fL (ref 80.0–100.0)
MONO ABS: 0.4 10*3/uL (ref 0.2–1.0)
MONOS PCT: 8 %
Neutro Abs: 2.9 10*3/uL (ref 1.4–6.5)
Neutrophils Relative %: 53 %
PLATELETS: 186 10*3/uL (ref 150–440)
RBC: 4.76 MIL/uL (ref 4.40–5.90)
RDW: 15.9 % — AB (ref 11.5–14.5)
WBC: 5.4 10*3/uL (ref 3.8–10.6)

## 2015-10-17 ENCOUNTER — Ambulatory Visit: Payer: Medicare Other | Admitting: Family Medicine

## 2015-10-18 ENCOUNTER — Ambulatory Visit (INDEPENDENT_AMBULATORY_CARE_PROVIDER_SITE_OTHER): Payer: Medicare Other | Admitting: Family Medicine

## 2015-10-18 ENCOUNTER — Encounter: Payer: Self-pay | Admitting: Family Medicine

## 2015-10-18 VITALS — BP 130/68 | HR 82 | Temp 97.9°F | Resp 16 | Ht 64.0 in | Wt 185.6 lb

## 2015-10-18 DIAGNOSIS — M1990 Unspecified osteoarthritis, unspecified site: Secondary | ICD-10-CM | POA: Diagnosis not present

## 2015-10-18 MED ORDER — MELOXICAM 15 MG PO TABS: 15.0000 mg | ORAL_TABLET | Freq: Every day | ORAL | Status: DC

## 2015-10-18 NOTE — Progress Notes (Signed)
Name: Roy Roberson   MRN: [id number]    DOB: 19-Dec-1945   Date:10/18/2015       Progress Note  Subjective  Chief Complaint  Chief Complaint  Patient presents with  . Follow-up    Paperwork for knee brace    HPI Here to request paperwork to be filled out to to get knee brace to help R knee pain.  Injured R knee 40-45 years ago.  Has had some problems on and off .  Over the past several weeks, he has had more pain and some more swelling.  R knee had given way once in a way.  Aleve may help a little bit. No problem-specific assessment & plan notes found for this encounter.   Past Medical History  Diagnosis Date  . Hypertension   . Decreased hearing of both ears   . Inflammatory arthritis ([place])   . Prediabetes   . Tobacco abuse   . Cardiac murmur   . ED (erectile dysfunction)   . Elevated liver function tests   . Chronic hepatitis C without hepatic coma ([place])   . Decreased hearing     Social History  Substance Use Topics  . Smoking status: Current Every Day Smoker -- 0.25 packs/day for 30 years  . Smokeless tobacco: Never Used     Comment: currently smokes 1/2 PPD.   Marland Kitchen Alcohol Use: Yes     Comment: 12 pack over the weekend.      Current outpatient prescriptions:  .  hydrochlorothiazide (MICROZIDE) 12.5 MG capsule, Take 1 capsule (12.5 mg total) by mouth daily., Disp: 30 capsule, Rfl: 12 .  losartan (COZAAR) 50 MG tablet, Take 1 tablet (50 mg total) by mouth daily., Disp: 30 tablet, Rfl: 12  Not on File  Review of Systems  Constitutional: Negative for fever, chills, weight loss and malaise/fatigue.  HENT: Positive for congestion (a cold recently). Negative for hearing loss.   Eyes: Negative for blurred vision and double vision.  Respiratory: Positive for cough (a cold recently). Negative for shortness of breath and wheezing.   Cardiovascular: Negative for chest pain, palpitations and leg swelling.  Gastrointestinal: Negative for heartburn, abdominal pain and  constipation.  Genitourinary: Negative for dysuria, urgency and frequency.  Musculoskeletal: Positive for joint pain (R knee).  Neurological: Negative for tremors, weakness and headaches.      Objective  Filed Vitals:   10/18/15 1323  BP: 130/68  Pulse: 82  Temp: 97.9 F (36.6 C)  TempSrc: Oral  Resp: 16  Height: 5' 4" (1.626 m)  Weight: 185 lb 9.6 oz (84.188 kg)     Physical Exam  Constitutional: He is well-developed, well-nourished, and in no distress. No distress.  HENT:  Head: Normocephalic and atraumatic.  Cardiovascular: Normal rate.   Murmur heard.  Systolic murmur is present with a grade of 3/6  URSB  Pulmonary/Chest: Effort normal and breath sounds normal. No respiratory distress. He has no wheezes. He has no rales.  Musculoskeletal:  R knee with some swelling medially.  Also some tenderness along medial joint line.  Some pain with extremes of ROM.  Patient reports knee giving way occ.  Vitals reviewed.     Recent Results (from the past 2160 hour(s))  Comprehensive metabolic panel     Status: Abnormal   Collection Time: 10/11/15 11:21 AM  Result Value Ref Range   Sodium 140 135 - 145 mmol/L   Potassium 3.8 3.5 - 5.1 mmol/L   Chloride 106 101 - 111 mmol/L  CO2 28 22 - 32 mmol/L   Glucose, Bld 102 (H) 65 - 99 mg/dL   BUN 11 6 - 20 mg/dL   Creatinine, Ser 0.91 0.61 - 1.24 mg/dL   Calcium 8.9 8.9 - 10.3 mg/dL   Total Protein 8.3 (H) 6.5 - 8.1 g/dL   Albumin 3.7 3.5 - 5.0 g/dL   AST 39 15 - 41 U/L   ALT 32 17 - 63 U/L   Alkaline Phosphatase 69 38 - 126 U/L   Total Bilirubin 0.5 0.3 - 1.2 mg/dL   GFR calc non Af Amer >60 >60 mL/min   GFR calc Af Amer >60 >60 mL/min    Comment: (NOTE) The eGFR has been calculated using the CKD EPI equation. This calculation has not been validated in all clinical situations. eGFR's persistently <60 mL/min signify possible Chronic Kidney Disease.    Anion gap 6 5 - 15  CBC with Differential/Platelet     Status:  Abnormal   Collection Time: 10/11/15 11:21 AM  Result Value Ref Range   WBC 5.4 3.8 - 10.6 K/uL   RBC 4.76 4.40 - 5.90 MIL/uL   Hemoglobin 14.2 13.0 - 18.0 g/dL   HCT 43.1 40.0 - 52.0 %   MCV 90.5 80.0 - 100.0 fL   MCH 29.7 26.0 - 34.0 pg   MCHC 32.8 32.0 - 36.0 g/dL   RDW 15.9 (H) 11.5 - 14.5 %   Platelets 186 150 - 440 K/uL   Neutrophils Relative % 53 %   Neutro Abs 2.9 1.4 - 6.5 K/uL   Lymphocytes Relative 34 %   Lymphs Abs 1.8 1.0 - 3.6 K/uL   Monocytes Relative 8 %   Monocytes Absolute 0.4 0.2 - 1.0 K/uL   Eosinophils Relative 4 %   Eosinophils Absolute 0.2 0 - 0.7 K/uL   Basophils Relative 1 %   Basophils Absolute 0.0 0 - 0.1 K/uL  Lipid panel     Status: Abnormal   Collection Time: 10/11/15 11:21 AM  Result Value Ref Range   Cholesterol 150 0 - 200 mg/dL   Triglycerides 145 <150 mg/dL   HDL 40 (L) >40 mg/dL   Total CHOL/HDL Ratio 3.8 RATIO   VLDL 29 0 - 40 mg/dL   LDL Cholesterol 81 0 - 99 mg/dL    Comment:        Total Cholesterol/HDL:CHD Risk Coronary Heart Disease Risk Table                     Men   Women  1/2 Average Risk   3.4   3.3  Average Risk       5.0   4.4  2 X Average Risk   9.6   7.1  3 X Average Risk  23.4   11.0        Use the calculated Patient Ratio above and the CHD Risk Table to determine the patient's CHD Risk.        ATP III CLASSIFICATION (LDL):  <100     mg/dL   Optimal  100-129  mg/dL   Near or Above                    Optimal  130-159  mg/dL   Borderline  160-189  mg/dL   High  >190     mg/dL   Very High      Assessment & Plan  1. Arthritis  - meloxicam (MOBIC) 15 MG tablet; Take 1 tablet (  15 mg total) by mouth daily.  Dispense: 30 tablet; Refill: 2 -Signed order for mail order knee brace for R knee.

## 2015-10-28 ENCOUNTER — Telehealth: Payer: Self-pay | Admitting: Family Medicine

## 2015-10-28 NOTE — Telephone Encounter (Signed)
Patient was given contact information to [employer] that requested form.

## 2015-10-28 NOTE — Telephone Encounter (Signed)
Pt saw Dr. Juanetta Gosling about getting a knee brace.  He called to see if it had been approved.  Please call [phone]

## 2015-11-13 ENCOUNTER — Other Ambulatory Visit: Payer: Self-pay | Admitting: Family Medicine

## 2015-11-29 ENCOUNTER — Telehealth: Payer: Self-pay

## 2015-11-29 ENCOUNTER — Other Ambulatory Visit: Payer: Self-pay

## 2015-11-29 DIAGNOSIS — B182 Chronic viral hepatitis C: Secondary | ICD-10-CM

## 2015-11-29 NOTE — Telephone Encounter (Signed)
-----   Message from Dunia Pringle [place], CMA sent at 05/10/2015 10:24 AM EDT ----- Regarding: repeat labs Call pt to repeat viral load and liver enzymes

## 2015-11-29 NOTE — Telephone Encounter (Signed)
Contacted pt and notified him he is due for his 6 months repeat on his viral load and LFT's. Pt stated he doesn't have a ride to go. Advised him to try and get this done whenever he can. We need to make sure we monitor his Hepatitis C. Orders have been put in EPIC.

## 2015-12-06 ENCOUNTER — Ambulatory Visit: Payer: Medicare Other | Admitting: Family Medicine

## 2015-12-20 ENCOUNTER — Encounter: Payer: Self-pay | Admitting: Family Medicine

## 2015-12-20 ENCOUNTER — Ambulatory Visit (INDEPENDENT_AMBULATORY_CARE_PROVIDER_SITE_OTHER): Payer: Medicare Other | Admitting: Family Medicine

## 2015-12-20 VITALS — BP 134/81 | HR 77 | Temp 97.9°F | Resp 16 | Ht 64.0 in | Wt 181.8 lb

## 2015-12-20 DIAGNOSIS — J302 Other seasonal allergic rhinitis: Secondary | ICD-10-CM | POA: Diagnosis not present

## 2015-12-20 DIAGNOSIS — B182 Chronic viral hepatitis C: Secondary | ICD-10-CM

## 2015-12-20 DIAGNOSIS — M1990 Unspecified osteoarthritis, unspecified site: Secondary | ICD-10-CM

## 2015-12-20 DIAGNOSIS — I10 Essential (primary) hypertension: Secondary | ICD-10-CM | POA: Diagnosis not present

## 2015-12-20 DIAGNOSIS — R739 Hyperglycemia, unspecified: Secondary | ICD-10-CM

## 2015-12-20 DIAGNOSIS — I350 Nonrheumatic aortic (valve) stenosis: Secondary | ICD-10-CM | POA: Diagnosis not present

## 2015-12-20 MED ORDER — LORATADINE 10 MG PO TABS: 10.0000 mg | ORAL_TABLET | Freq: Every day | ORAL | Status: DC

## 2015-12-20 MED ORDER — MELOXICAM 15 MG PO TABS: 15.0000 mg | ORAL_TABLET | Freq: Every day | ORAL | Status: DC

## 2015-12-20 NOTE — Progress Notes (Signed)
Name: Roy Roberson   MRN: [id number]    DOB: [birth year]   Date:12/20/2015       Progress Note  Subjective  Chief Complaint  Chief Complaint  Patient presents with  . Follow-up    Arthritis    HPI Here for f/u of arthritis and HBP.  BP doing ok.  Arthritis in knee is doing well.  Now with more arthritis pain in wrists and thumbs. Also c/o some numbness in distal thumbs. Ssys his ln ee is doing better, but would like a knee brace to use when needed.  C/o a throaty cough with white sputum from post nasal drip.  No chest congestion.  Nol fever.   No wheezes.  No problem-specific assessment & plan notes found for this encounter.   Past Medical History  Diagnosis Date  . Hypertension   . Decreased hearing of both ears   . Inflammatory arthritis ([place])   . Prediabetes   . Tobacco abuse   . Cardiac murmur   . ED (erectile dysfunction)   . Elevated liver function tests   . Chronic hepatitis C without hepatic coma ([place])   . Decreased hearing     Past Surgical History  Procedure Laterality Date  . Mandible surgery      Family History  Problem Relation Age of Onset  . Cancer Mother   . Diabetes Mother     Social History   Social History  . Marital Status: Single    Spouse Name: N/A  . Number of Children: N/A  . Years of Education: N/A   Occupational History  . Not on file.   Social History Main Topics  . Smoking status: Current Every Day Smoker -- 0.25 packs/day for 30 years  . Smokeless tobacco: Never Used     Comment: currently smokes 1/2 PPD.   Marland Kitchen Alcohol Use: Yes     Comment: 12 pack over the weekend.   . Drug Use: No  . Sexual Activity: Not on file   Other Topics Concern  . Not on file   Social History Narrative     Current outpatient prescriptions:  .  hydrochlorothiazide (HYDRODIURIL) 12.5 MG tablet, TAKE 1 TABLET BY MOUTH EVERY DAY, Disp: 30 tablet, Rfl: 10 .  losartan (COZAAR) 50 MG tablet, Take 1 tablet (50 mg total) by mouth daily., Disp: 30  tablet, Rfl: 12 .  meloxicam (MOBIC) 15 MG tablet, Take 1 tablet (15 mg total) by mouth daily., Disp: 30 tablet, Rfl: 3 .  loratadine (CLARITIN) 10 MG tablet, Take 1 tablet (10 mg total) by mouth daily. For allergies., Disp: 30 tablet, Rfl: 11  Not on File   Review of Systems  Constitutional: Negative for fever, chills, weight loss and malaise/fatigue.  HENT: Negative for hearing loss.   Eyes: Negative for blurred vision and double vision.  Respiratory: Negative for cough, shortness of breath and wheezing.   Cardiovascular: Negative for chest pain, palpitations and leg swelling.  Gastrointestinal: Negative for heartburn, abdominal pain and blood in stool.  Genitourinary: Negative for dysuria, urgency and frequency.  Musculoskeletal: Positive for joint pain. Negative for myalgias.  Skin: Negative for rash.  Neurological: Negative for dizziness, tremors, weakness and headaches.      Objective  Filed Vitals:   12/20/15 1525  BP: 134/81  Pulse: 77  Temp: 97.9 F (36.6 C)  TempSrc: Oral  Resp: 16  Height: '5\' 4"'  (1.626 m)  Weight: 181 lb 12.8 oz (82.464 kg)    Physical Exam  Constitutional: He is oriented to person, place, and time and well-developed, well-nourished, and in no distress. No distress.  HENT:  Head: Normocephalic and atraumatic.  Nose: Rhinorrhea (clear) present.  Eyes: Conjunctivae and EOM are normal. Pupils are equal, round, and reactive to light. No scleral icterus.  Neck: Normal range of motion. Neck supple. Carotid bruit is not present. No thyromegaly present.  Cardiovascular: Normal rate and regular rhythm.  Exam reveals no gallop and no friction rub.   Murmur heard.  Systolic murmur is present with a grade of 3/6  throughout  Pulmonary/Chest: Effort normal and breath sounds normal. No respiratory distress. He has no wheezes. He has no rales.  Abdominal: Soft. Bowel sounds are normal. He exhibits no distension and no mass. There is no tenderness.   Musculoskeletal: He exhibits no edema.  Pain in R knee is improved with ROM from last visit.  Bilateral thumb MC-C joints tender to palp and with ROM.    Lymphadenopathy:    He has no cervical adenopathy.  Neurological: He is alert and oriented to person, place, and time.  L thumb feels numb on finger pad.  Vitals reviewed.      Recent Results (from the past 2160 hour(s))  Comprehensive metabolic panel     Status: Abnormal   Collection Time: 10/11/15 11:21 AM  Result Value Ref Range   Sodium 140 135 - 145 mmol/L   Potassium 3.8 3.5 - 5.1 mmol/L   Chloride 106 101 - 111 mmol/L   CO2 28 22 - 32 mmol/L   Glucose, Bld 102 (H) 65 - 99 mg/dL   BUN 11 6 - 20 mg/dL   Creatinine, Ser 0.91 0.61 - 1.24 mg/dL   Calcium 8.9 8.9 - 10.3 mg/dL   Total Protein 8.3 (H) 6.5 - 8.1 g/dL   Albumin 3.7 3.5 - 5.0 g/dL   AST 39 15 - 41 U/L   ALT 32 17 - 63 U/L   Alkaline Phosphatase 69 38 - 126 U/L   Total Bilirubin 0.5 0.3 - 1.2 mg/dL   GFR calc non Af Amer >60 >60 mL/min   GFR calc Af Amer >60 >60 mL/min    Comment: (NOTE) The eGFR has been calculated using the CKD EPI equation. This calculation has not been validated in all clinical situations. eGFR's persistently <60 mL/min signify possible Chronic Kidney Disease.    Anion gap 6 5 - 15  CBC with Differential/Platelet     Status: Abnormal   Collection Time: 10/11/15 11:21 AM  Result Value Ref Range   WBC 5.4 3.8 - 10.6 K/uL   RBC 4.76 4.40 - 5.90 MIL/uL   Hemoglobin 14.2 13.0 - 18.0 g/dL   HCT 43.1 40.0 - 52.0 %   MCV 90.5 80.0 - 100.0 fL   MCH 29.7 26.0 - 34.0 pg   MCHC 32.8 32.0 - 36.0 g/dL   RDW 15.9 (H) 11.5 - 14.5 %   Platelets 186 150 - 440 K/uL   Neutrophils Relative % 53 %   Neutro Abs 2.9 1.4 - 6.5 K/uL   Lymphocytes Relative 34 %   Lymphs Abs 1.8 1.0 - 3.6 K/uL   Monocytes Relative 8 %   Monocytes Absolute 0.4 0.2 - 1.0 K/uL   Eosinophils Relative 4 %   Eosinophils Absolute 0.2 0 - 0.7 K/uL   Basophils Relative 1 %    Basophils Absolute 0.0 0 - 0.1 K/uL  Lipid panel     Status: Abnormal   Collection Time: 10/11/15 11:21  AM  Result Value Ref Range   Cholesterol 150 0 - 200 mg/dL   Triglycerides 145 <150 mg/dL   HDL 40 (L) >40 mg/dL   Total CHOL/HDL Ratio 3.8 RATIO   VLDL 29 0 - 40 mg/dL   LDL Cholesterol 81 0 - 99 mg/dL    Comment:        Total Cholesterol/HDL:CHD Risk Coronary Heart Disease Risk Table                     Men   Women  1/2 Average Risk   3.4   3.3  Average Risk       5.0   4.4  2 X Average Risk   9.6   7.1  3 X Average Risk  23.4   11.0        Use the calculated Patient Ratio above and the CHD Risk Table to determine the patient's CHD Risk.        ATP III CLASSIFICATION (LDL):  <100     mg/dL   Optimal  100-129  mg/dL   Near or Above                    Optimal  130-159  mg/dL   Borderline  160-189  mg/dL   High  >190     mg/dL   Very High      Assessment & Plan  Problem List Items Addressed This Visit      Cardiovascular and Mediastinum   Essential (primary) hypertension   Aortic stenosis     Respiratory   Seasonal allergies   Relevant Medications   loratadine (CLARITIN) 10 MG tablet     Digestive   Chronic hepatitis C ([facility])     Musculoskeletal and Integument   Arthritis - Primary   Relevant Medications   meloxicam (MOBIC) 15 MG tablet     Other   Hyperglycemia   Relevant Orders   POCT HgB A1C      Meds ordered this encounter  Medications  . loratadine (CLARITIN) 10 MG tablet    Sig: Take 1 tablet (10 mg total) by mouth daily. For allergies.    Dispense:  30 tablet    Refill:  11  . meloxicam (MOBIC) 15 MG tablet    Sig: Take 1 tablet (15 mg total) by mouth daily.    Dispense:  30 tablet    Refill:  3   1. Hyperglycemia  - POCT HgB A1C-5.7  2. Arthritis  - meloxicam (MOBIC) 15 MG tablet; Take 1 tablet (15 mg total) by mouth daily.  Dispense: 30 tablet; Refill: 3  3. Essential (primary) hypertension Cont HCTZ and Losartan  4. Aortic  stenosis   5. Chronic hepatitis C without hepatic coma ([place])   6. Seasonal allergies  - loratadine (CLARITIN) 10 MG tablet; Take 1 tablet (10 mg total) by mouth daily. For allergies.  Dispense: 30 tablet; Refill: 11  7. Arthritis  - meloxicam (MOBIC) 15 MG tablet; Take 1 tablet (15 mg total) by mouth daily.  Dispense: 30 tablet; Refill: 3

## 2015-12-20 NOTE — Patient Instructions (Signed)
Prescrition for knee brace for arthritis written

## 2016-03-27 ENCOUNTER — Ambulatory Visit: Payer: Medicare Other | Admitting: Family Medicine

## 2016-03-28 ENCOUNTER — Ambulatory Visit (INDEPENDENT_AMBULATORY_CARE_PROVIDER_SITE_OTHER): Payer: Commercial Managed Care - HMO | Admitting: Family Medicine

## 2016-03-28 ENCOUNTER — Encounter: Payer: Self-pay | Admitting: Family Medicine

## 2016-03-28 VITALS — BP 132/75 | HR 97 | Temp 97.5°F | Resp 16 | Ht 64.0 in | Wt 183.0 lb

## 2016-03-28 DIAGNOSIS — I10 Essential (primary) hypertension: Secondary | ICD-10-CM | POA: Diagnosis not present

## 2016-03-28 DIAGNOSIS — M1990 Unspecified osteoarthritis, unspecified site: Secondary | ICD-10-CM

## 2016-03-28 DIAGNOSIS — R011 Cardiac murmur, unspecified: Secondary | ICD-10-CM | POA: Diagnosis not present

## 2016-03-28 MED ORDER — TRAMADOL HCL 50 MG PO TABS: 50.0000 mg | ORAL_TABLET | Freq: Three times a day (TID) | ORAL | Status: DC | PRN

## 2016-03-28 NOTE — Progress Notes (Signed)
Name: Roy Roberson   MRN: [id number]    DOB: 08/29/1946   Date:03/28/2016       Progress Note  Subjective  Chief Complaint  Chief Complaint  Patient presents with  . Hypertension  . Arthritis    HPI Here for f/u of HBP and arthritis.  C/o pain in his thumbs ad 2nd and 3rd fingers bilaterally.  Taking his BP meds and Mobic daily.  No problem-specific assessment & plan notes found for this encounter.   Past Medical History  Diagnosis Date  . Hypertension   . Decreased hearing of both ears   . Inflammatory arthritis ([facility])   . Prediabetes   . Tobacco abuse   . Cardiac murmur   . ED (erectile dysfunction)   . Elevated liver function tests   . Chronic hepatitis C without hepatic coma ([facility])   . Decreased hearing     Past Surgical History  Procedure Laterality Date  . Mandible surgery      Family History  Problem Relation Age of Onset  . Cancer Mother   . Diabetes Mother     Social History   Social History  . Marital Status: Single    Spouse Name: N/A  . Number of Children: N/A  . Years of Education: N/A   Occupational History  . Not on file.   Social History Main Topics  . Smoking status: Current Every Day Smoker -- 0.25 packs/day for 30 years  . Smokeless tobacco: Never Used     Comment: currently smokes 1/2 PPD.   Marland Kitchen. Alcohol Use: Yes     Comment: 12 pack over the weekend.   . Drug Use: No  . Sexual Activity: Not on file   Other Topics Concern  . Not on file   Social History Narrative     Current outpatient prescriptions:  .  hydrochlorothiazide (HYDRODIURIL) 12.5 MG tablet, TAKE 1 TABLET BY MOUTH EVERY DAY, Disp: 30 tablet, Rfl: 10 .  loratadine (CLARITIN) 10 MG tablet, Take 1 tablet (10 mg total) by mouth daily. For allergies., Disp: 30 tablet, Rfl: 11 .  losartan (COZAAR) 50 MG tablet, Take 1 tablet (50 mg total) by mouth daily., Disp: 30 tablet, Rfl: 12 .  meloxicam (MOBIC) 15 MG tablet, Take 1 tablet (15 mg total) by mouth daily., Disp: 30  tablet, Rfl: 3 .  traMADol (ULTRAM) 50 MG tablet, Take 1 tablet (50 mg total) by mouth every 8 (eight) hours as needed., Disp: 90 tablet, Rfl: 5  No Known Allergies   Review of Systems  Constitutional: Negative for fever, chills, weight loss and malaise/fatigue.  HENT: Negative for hearing loss.   Eyes: Negative for blurred vision and double vision.  Respiratory: Negative for cough, shortness of breath and wheezing.   Cardiovascular: Negative for chest pain, palpitations and leg swelling.  Gastrointestinal: Negative for heartburn, abdominal pain and blood in stool.  Genitourinary: Negative for dysuria, urgency and frequency.  Musculoskeletal: Positive for joint pain (bilateral hands and fingers).  Skin: Negative for rash.  Neurological: Negative for dizziness, tremors, weakness and headaches.      Objective  Filed Vitals:   03/28/16 1401  BP: 132/75  Pulse: 97  Temp: 97.5 F (36.4 C)  TempSrc: Oral  Resp: 16  Height: 5\' 4"  (1.626 m)  Weight: 183 lb (83.008 kg)    Physical Exam  Constitutional: He is oriented to person, place, and time and well-developed, well-nourished, and in no distress. No distress.  HENT:  Head: Normocephalic and  atraumatic.  Eyes: Conjunctivae and EOM are normal. Pupils are equal, round, and reactive to light. No scleral icterus.  Neck: Normal range of motion. Neck supple. Carotid bruit is not present. No thyromegaly present.  Cardiovascular: Normal rate and regular rhythm.  Exam reveals no gallop and no friction rub.   Murmur heard.  Systolic murmur is present with a grade of 3/6  throughouit  Pulmonary/Chest: Effort normal and breath sounds normal. No respiratory distress. He has no wheezes. He has no rales.  Abdominal: Soft. Bowel sounds are normal. He exhibits no distension, no abdominal bruit and no mass. There is no tenderness.  Musculoskeletal: He exhibits no edema.  Some pain with ROM of bilateral MC-C joints and DIP joints of 2nd and 3rd  fingers.  Some p(ain with ROM of bilateral knees  Lymphadenopathy:    He has no cervical adenopathy.  Neurological: He is alert and oriented to person, place, and time.  Vitals reviewed.      No results found for this or any previous visit (from the past 2160 hour(s)).   Assessment & Plan  Problem List Items Addressed This Visit      Cardiovascular and Mediastinum   Essential (primary) hypertension - Primary     Musculoskeletal and Integument   Arthritis   Relevant Medications   traMADol (ULTRAM) 50 MG tablet   Other Relevant Orders   Ambulatory referral to Orthopedic Surgery     Other   Cardiac murmur      Meds ordered this encounter  Medications  . traMADol (ULTRAM) 50 MG tablet    Sig: Take 1 tablet (50 mg total) by mouth every 8 (eight) hours as needed.    Dispense:  90 tablet    Refill:  5   1. Essential (primary) hypertension Cont Losartan and HCTZ.  2. Arthritis Limit Mobic to occ use only Use Tramadol tid prn pain Avoid alcohol use. - Ambulatory referral to Orthopedic Surgery  3. Cardiac murmur

## 2016-03-29 ENCOUNTER — Telehealth: Payer: Self-pay | Admitting: Family Medicine

## 2016-03-29 NOTE — Telephone Encounter (Signed)
Joni at [facility] requested a [employer] referral for pt's visit tomorrow.  Her call back number is [phone] ext [phone]

## 2016-03-29 NOTE — Telephone Encounter (Signed)
Done.[place] 

## 2016-03-30 DIAGNOSIS — M170 Bilateral primary osteoarthritis of knee: Secondary | ICD-10-CM | POA: Diagnosis not present

## 2016-03-30 DIAGNOSIS — M180 Bilateral primary osteoarthritis of first carpometacarpal joints: Secondary | ICD-10-CM | POA: Diagnosis not present

## 2016-06-12 ENCOUNTER — Emergency Department: Payer: Commercial Managed Care - HMO

## 2016-06-12 ENCOUNTER — Encounter: Payer: Self-pay | Admitting: Emergency Medicine

## 2016-06-12 ENCOUNTER — Emergency Department
Admission: EM | Admit: 2016-06-12 | Discharge: 2016-06-12 | Disposition: A | Discharge status: Home / Self Care | Payer: Commercial Managed Care - HMO | Attending: Emergency Medicine | Admitting: Emergency Medicine

## 2016-06-12 DIAGNOSIS — I10 Essential (primary) hypertension: Secondary | ICD-10-CM | POA: Diagnosis not present

## 2016-06-12 DIAGNOSIS — S299XXA Unspecified injury of thorax, initial encounter: Secondary | ICD-10-CM | POA: Diagnosis present

## 2016-06-12 DIAGNOSIS — T07XXXA Unspecified multiple injuries, initial encounter: Secondary | ICD-10-CM

## 2016-06-12 DIAGNOSIS — S50319A Abrasion of unspecified elbow, initial encounter: Secondary | ICD-10-CM | POA: Insufficient documentation

## 2016-06-12 DIAGNOSIS — S60511A Abrasion of right hand, initial encounter: Secondary | ICD-10-CM | POA: Diagnosis not present

## 2016-06-12 DIAGNOSIS — Z79899 Other long term (current) drug therapy: Secondary | ICD-10-CM | POA: Diagnosis not present

## 2016-06-12 DIAGNOSIS — Y92410 Unspecified street and highway as the place of occurrence of the external cause: Secondary | ICD-10-CM | POA: Diagnosis not present

## 2016-06-12 DIAGNOSIS — Y999 Unspecified external cause status: Secondary | ICD-10-CM | POA: Diagnosis not present

## 2016-06-12 DIAGNOSIS — S80219A Abrasion, unspecified knee, initial encounter: Secondary | ICD-10-CM | POA: Insufficient documentation

## 2016-06-12 DIAGNOSIS — S2242XA Multiple fractures of ribs, left side, initial encounter for closed fracture: Secondary | ICD-10-CM | POA: Insufficient documentation

## 2016-06-12 DIAGNOSIS — S50311A Abrasion of right elbow, initial encounter: Secondary | ICD-10-CM | POA: Diagnosis not present

## 2016-06-12 DIAGNOSIS — Y9389 Activity, other specified: Secondary | ICD-10-CM | POA: Diagnosis not present

## 2016-06-12 DIAGNOSIS — S60519A Abrasion of unspecified hand, initial encounter: Secondary | ICD-10-CM | POA: Insufficient documentation

## 2016-06-12 DIAGNOSIS — F17200 Nicotine dependence, unspecified, uncomplicated: Secondary | ICD-10-CM | POA: Insufficient documentation

## 2016-06-12 DIAGNOSIS — S60512A Abrasion of left hand, initial encounter: Secondary | ICD-10-CM | POA: Diagnosis not present

## 2016-06-12 MED ORDER — BACITRACIN ZINC 500 UNIT/GM EX OINT
TOPICAL_OINTMENT | Freq: Once | CUTANEOUS | Status: DC
  Filled 2016-06-12: qty 0.9

## 2016-06-12 MED ORDER — IBUPROFEN 800 MG PO TABS: 800.0000 mg | ORAL_TABLET | Freq: Three times a day (TID) | ORAL | 0 refills | Status: DC | PRN

## 2016-06-12 MED ORDER — CYCLOBENZAPRINE HCL 10 MG PO TABS
10.0000 mg | ORAL_TABLET | Freq: Once | ORAL | Status: AC
  Administered 2016-06-12: 10 mg via ORAL
  Filled 2016-06-12: qty 1

## 2016-06-12 MED ORDER — IBUPROFEN 600 MG PO TABS
600.0000 mg | ORAL_TABLET | Freq: Once | ORAL | Status: AC
  Administered 2016-06-12: 600 mg via ORAL
  Filled 2016-06-12: qty 1

## 2016-06-12 MED ORDER — CYCLOBENZAPRINE HCL 5 MG PO TABS: 5.0000 mg | ORAL_TABLET | Freq: Three times a day (TID) | ORAL | 0 refills | Status: DC | PRN

## 2016-06-12 MED ORDER — HYDROCODONE-ACETAMINOPHEN 5-325 MG PO TABS: 1.0000 | ORAL_TABLET | Freq: Four times a day (QID) | ORAL | 0 refills | Status: DC | PRN
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
## 2016-06-12 NOTE — Discharge Instructions (Signed)
Take the prescription meds as directed. Apply ice to the ribs for pain relief. Apply antibiotic ointment to the abrasions to promote healing. Do not allow abrasions to dry and scab. Follow-up with Dr. Juanetta GoslingHawkins as needed.
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
## 2016-06-16 ENCOUNTER — Encounter: Payer: Self-pay | Admitting: Emergency Medicine

## 2016-06-16 ENCOUNTER — Emergency Department
Admission: EM | Admit: 2016-06-16 | Discharge: 2016-06-16 | Disposition: A | Discharge status: Home / Self Care | Payer: Commercial Managed Care - HMO | Attending: Student | Admitting: Student

## 2016-06-16 ENCOUNTER — Emergency Department: Payer: Commercial Managed Care - HMO

## 2016-06-16 DIAGNOSIS — Z79899 Other long term (current) drug therapy: Secondary | ICD-10-CM | POA: Insufficient documentation

## 2016-06-16 DIAGNOSIS — Z791 Long term (current) use of non-steroidal anti-inflammatories (NSAID): Secondary | ICD-10-CM | POA: Insufficient documentation

## 2016-06-16 DIAGNOSIS — I10 Essential (primary) hypertension: Secondary | ICD-10-CM | POA: Insufficient documentation

## 2016-06-16 DIAGNOSIS — R109 Unspecified abdominal pain: Secondary | ICD-10-CM | POA: Diagnosis not present

## 2016-06-16 DIAGNOSIS — R0781 Pleurodynia: Secondary | ICD-10-CM | POA: Diagnosis not present

## 2016-06-16 DIAGNOSIS — J9811 Atelectasis: Secondary | ICD-10-CM | POA: Diagnosis not present

## 2016-06-16 DIAGNOSIS — S3991XA Unspecified injury of abdomen, initial encounter: Secondary | ICD-10-CM | POA: Diagnosis not present

## 2016-06-16 DIAGNOSIS — R319 Hematuria, unspecified: Secondary | ICD-10-CM | POA: Insufficient documentation

## 2016-06-16 DIAGNOSIS — F17200 Nicotine dependence, unspecified, uncomplicated: Secondary | ICD-10-CM | POA: Insufficient documentation

## 2016-06-16 DIAGNOSIS — J90 Pleural effusion, not elsewhere classified: Secondary | ICD-10-CM | POA: Diagnosis not present

## 2016-06-16 DIAGNOSIS — S299XXA Unspecified injury of thorax, initial encounter: Secondary | ICD-10-CM | POA: Diagnosis not present

## 2016-06-16 LAB — CBC WITH DIFFERENTIAL/PLATELET
Basophils Absolute: 0 10*3/uL (ref 0–0.1)
Basophils Relative: 0 %
Eosinophils Absolute: 0 10*3/uL (ref 0–0.7)
Eosinophils Relative: 0 %
HEMATOCRIT: 44.7 % (ref 40.0–52.0)
HEMOGLOBIN: 15 g/dL (ref 13.0–18.0)
LYMPHS ABS: 1.7 10*3/uL (ref 1.0–3.6)
LYMPHS PCT: 14 %
MCH: 30.7 pg (ref 26.0–34.0)
MCHC: 33.5 g/dL (ref 32.0–36.0)
MCV: 91.6 fL (ref 80.0–100.0)
MONO ABS: 0.9 10*3/uL (ref 0.2–1.0)
MONOS PCT: 7 %
NEUTROS ABS: 9.7 10*3/uL — AB (ref 1.4–6.5)
NEUTROS PCT: 79 %
Platelets: 167 10*3/uL (ref 150–440)
RBC: 4.88 MIL/uL (ref 4.40–5.90)
RDW: 15.9 % — ABNORMAL HIGH (ref 11.5–14.5)
WBC: 12.5 10*3/uL — ABNORMAL HIGH (ref 3.8–10.6)

## 2016-06-16 LAB — COMPREHENSIVE METABOLIC PANEL
ALK PHOS: 78 U/L (ref 38–126)
ALT: 27 U/L (ref 17–63)
AST: 37 U/L (ref 15–41)
Albumin: 3.3 g/dL — ABNORMAL LOW (ref 3.5–5.0)
Anion gap: 7 (ref 5–15)
BILIRUBIN TOTAL: 1.8 mg/dL — AB (ref 0.3–1.2)
BUN: 16 mg/dL (ref 6–20)
CALCIUM: 8.6 mg/dL — AB (ref 8.9–10.3)
CO2: 27 mmol/L (ref 22–32)
CREATININE: 1.12 mg/dL (ref 0.61–1.24)
Chloride: 100 mmol/L — ABNORMAL LOW (ref 101–111)
GFR calc Af Amer: >60 mL/min (ref >60)
Glucose, Bld: 123 mg/dL — ABNORMAL HIGH (ref 65–99)
POTASSIUM: 3.3 mmol/L — AB (ref 3.5–5.1)
Sodium: 134 mmol/L — ABNORMAL LOW (ref 135–145)
TOTAL PROTEIN: 7.9 g/dL (ref 6.5–8.1)

## 2016-06-16 LAB — URINALYSIS COMPLETE WITH MICROSCOPIC (ARMC ONLY)
Bacteria, UA: NONE SEEN
GLUCOSE, UA: NEGATIVE mg/dL
Hgb urine dipstick: NEGATIVE
KETONES UR: NEGATIVE mg/dL
Leukocytes, UA: NEGATIVE
Nitrite: NEGATIVE
PH: 6 (ref 5.0–8.0)
PROTEIN: 30 mg/dL — AB
SQUAMOUS EPITHELIAL / LPF: NONE SEEN
Specific Gravity, Urine: >1.06 — ABNORMAL HIGH (ref 1.005–1.030)

## 2016-06-16 MED ORDER — IOPAMIDOL (ISOVUE-300) INJECTION 61%
100.0000 mL | Freq: Once | INTRAVENOUS | Status: AC | PRN
  Administered 2016-06-16: 100 mL via INTRAVENOUS

## 2016-06-16 MED ORDER — MORPHINE SULFATE (PF) 4 MG/ML IV SOLN
4.0000 mg | Freq: Once | INTRAVENOUS | Status: AC
  Administered 2016-06-16: 4 mg via INTRAVENOUS
  Filled 2016-06-16: qty 1

## 2016-06-16 MED ORDER — SODIUM CHLORIDE 0.9 % IV BOLUS (SEPSIS)
500.0000 mL | Freq: Once | INTRAVENOUS | Status: AC
  Administered 2016-06-16: 500 mL via INTRAVENOUS

## 2016-06-16 MED ORDER — ONDANSETRON HCL 4 MG/2ML IJ SOLN
4.0000 mg | Freq: Once | INTRAMUSCULAR | Status: AC
  Administered 2016-06-16: 4 mg via INTRAVENOUS
  Filled 2016-06-16: qty 2

## 2016-06-16 MED ORDER — OXYCODONE HCL 5 MG PO TABS: 5.0000 mg | ORAL_TABLET | Freq: Four times a day (QID) | ORAL | 0 refills | Status: DC | PRN
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
## 2016-06-16 NOTE — ED Notes (Signed)
This RN reviewed incentive spirometry use with patient. Pt able to perform return demonstration. Pt denies any comments/concerns about using incentive spirometer. Pt instructed to use approx 10times/hour while awake. NAD noted at this time. Will continue to monitor.

## 2016-06-16 NOTE — ED Notes (Signed)
This RN to bedside to encourage patient to urinate for sample. Pt states that he cannot urinate at this time but will continue to try. MD to bedside at this time as well.
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
## 2016-06-16 NOTE — ED Notes (Signed)
This RN instructed patient that due to receiving pain medications this RN did not want patient to stand up without assistance and fall. Pt states understanding, this RN reviewed call bell and instructed patient to push the button if he needed assistance. Pt states understanding at this time.

## 2016-06-16 NOTE — ED Notes (Signed)
Pt able to give a urine sample at this time, urine noted to be reddish/orange in color at this time. MD notified. Will continue to monitor for further patient needs.

## 2016-06-18 LAB — URINE CULTURE
CULTURE: NO GROWTH
Special Requests: NORMAL

## 2016-06-21 LAB — CULTURE, BLOOD (ROUTINE X 2)
Culture: NO GROWTH
Culture: NO GROWTH

## 2016-06-25 ENCOUNTER — Ambulatory Visit: Payer: Commercial Managed Care - HMO | Admitting: Family Medicine

## 2016-06-28 ENCOUNTER — Encounter: Payer: Self-pay | Admitting: Family Medicine

## 2016-06-28 ENCOUNTER — Ambulatory Visit (INDEPENDENT_AMBULATORY_CARE_PROVIDER_SITE_OTHER): Payer: Commercial Managed Care - HMO | Admitting: Family Medicine

## 2016-06-28 VITALS — BP 130/70 | HR 92 | Temp 97.6°F | Resp 16 | Ht 64.0 in | Wt 173.0 lb

## 2016-06-28 DIAGNOSIS — B182 Chronic viral hepatitis C: Secondary | ICD-10-CM | POA: Diagnosis not present

## 2016-06-28 DIAGNOSIS — Z23 Encounter for immunization: Secondary | ICD-10-CM | POA: Diagnosis not present

## 2016-06-28 DIAGNOSIS — I350 Nonrheumatic aortic (valve) stenosis: Secondary | ICD-10-CM

## 2016-06-28 DIAGNOSIS — I10 Essential (primary) hypertension: Secondary | ICD-10-CM | POA: Diagnosis not present

## 2016-06-28 DIAGNOSIS — M1990 Unspecified osteoarthritis, unspecified site: Secondary | ICD-10-CM | POA: Diagnosis not present
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
## 2016-07-31 ENCOUNTER — Ambulatory Visit: Payer: Commercial Managed Care - HMO | Admitting: Family Medicine

## 2016-10-19 ENCOUNTER — Ambulatory Visit (INDEPENDENT_AMBULATORY_CARE_PROVIDER_SITE_OTHER): Payer: Commercial Managed Care - HMO | Admitting: *Deleted

## 2016-10-19 DIAGNOSIS — Z23 Encounter for immunization: Secondary | ICD-10-CM

## 2016-12-31 ENCOUNTER — Ambulatory Visit: Payer: Commercial Managed Care - HMO | Admitting: Family Medicine

## 2017-01-04 ENCOUNTER — Ambulatory Visit: Payer: Commercial Managed Care - HMO | Admitting: Family Medicine

## 2017-01-07 ENCOUNTER — Ambulatory Visit: Payer: Commercial Managed Care - HMO | Admitting: Family Medicine

## 2017-01-08 ENCOUNTER — Ambulatory Visit (INDEPENDENT_AMBULATORY_CARE_PROVIDER_SITE_OTHER): Payer: Commercial Managed Care - HMO | Admitting: Family Medicine

## 2017-01-08 ENCOUNTER — Encounter: Payer: Self-pay | Admitting: Family Medicine

## 2017-01-08 VITALS — BP 113/59 | HR 100 | Temp 97.8°F | Resp 16 | Ht 64.0 in | Wt 162.0 lb

## 2017-01-08 DIAGNOSIS — M1811 Unilateral primary osteoarthritis of first carpometacarpal joint, right hand: Secondary | ICD-10-CM | POA: Diagnosis not present

## 2017-01-08 DIAGNOSIS — M1812 Unilateral primary osteoarthritis of first carpometacarpal joint, left hand: Secondary | ICD-10-CM | POA: Diagnosis not present

## 2017-01-08 DIAGNOSIS — M79641 Pain in right hand: Secondary | ICD-10-CM

## 2017-01-08 DIAGNOSIS — M79642 Pain in left hand: Secondary | ICD-10-CM | POA: Diagnosis not present

## 2017-01-08 DIAGNOSIS — M180 Bilateral primary osteoarthritis of first carpometacarpal joints: Secondary | ICD-10-CM

## 2017-01-08 MED ORDER — DICLOFENAC SODIUM 1 % TD GEL: 2.0000 g | Freq: Three times a day (TID) | TRANSDERMAL | 2 refills | Status: DC | PRN

## 2017-01-08 MED ORDER — PREDNISONE 10 MG PO TABS: ORAL_TABLET | ORAL | 0 refills | Status: DC
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
## 2017-01-08 NOTE — Patient Instructions (Signed)
Thank you for coming to the clinic today.  1. You have Thumb and Hand Arthritis, from your prior injury  Prescription to start with Prednisone (anti-inflammatory) take  tablets - take 6 tablets with breakfast DAY 1, then next day take 5 tablets on Day 2, keep reducing by 1 pill each day until you finish after 6 days - WHILE TAKING THIS MEDICINE you CANNOT take Ibuprofen, Aleve - once FINISHED you may resume Aleve  Stop taking Meloxicam  Sent prescription of Diclofenac gel - use small amount on both thumbs / hands up to 3 times a day  FOR BOTH new prescriptions, will need to get approval from insurance, it may take a few days to get medicine.  Recommend trial of Anti-inflammatory with OTC Aleve (Naproxen)  tabs - take TWO tablets with food and plenty of water TWICE daily every day (breakfast and dinner), for next 2 to 4 weeks, then you may take only as needed - DO NOT TAKE any ibuprofen, aleve, motrin while you are taking this medicine - It is safe to take Tylenol Ext Str  tabs - take 1 to 2 (max dose ) every 6 hours as needed for breakthrough pain, max 24 hour daily dose is 6 to 8 tablets or   Please request records from [facility] Dr Juanell Fairly (or other provider) last office visit and procedure note.  Please call to schedule follow-up with your [facility] doctor to follow-up Thumb Arthritis Pain  [facility] Address: [address] [place], [place], [place] [address] Hours:  9AM-5PM Phone: [phone]  Please schedule a follow-up appointment with Dr. Althea Charon in 6 weeks 3 months to follow-up on Thumb Arthritis  If you have any other questions or concerns, please feel free to call the clinic or send a message through MyChart. You may also schedule an earlier appointment if necessary.  Saralyn Pilar, DO [facility], [place]

## 2017-01-16 ENCOUNTER — Telehealth: Payer: Self-pay

## 2017-01-16 NOTE — Telephone Encounter (Signed)
As per Homero FellersFrank from [facility]-- patient's diclofenac has been approved last filled was 11/2016 so next one will be from 01/25/17 till 05/2017 with approval ref# [id number]. pharmacy is aware for coverage and will let patient know this too.

## 2017-02-11 IMAGING — CR DG CHEST 2V
1 series · 2 of 2 positions shown · non-contrast
Comparison: None.

CLINICAL DATA: Intermittently productive cough for the past 2
months. History of tobacco use, hepatitis.

EXAM:
CHEST  2 VIEW

[Series 1: pa · 0.17mm/px · 2 of 2 slices shown]
[im 1/2]
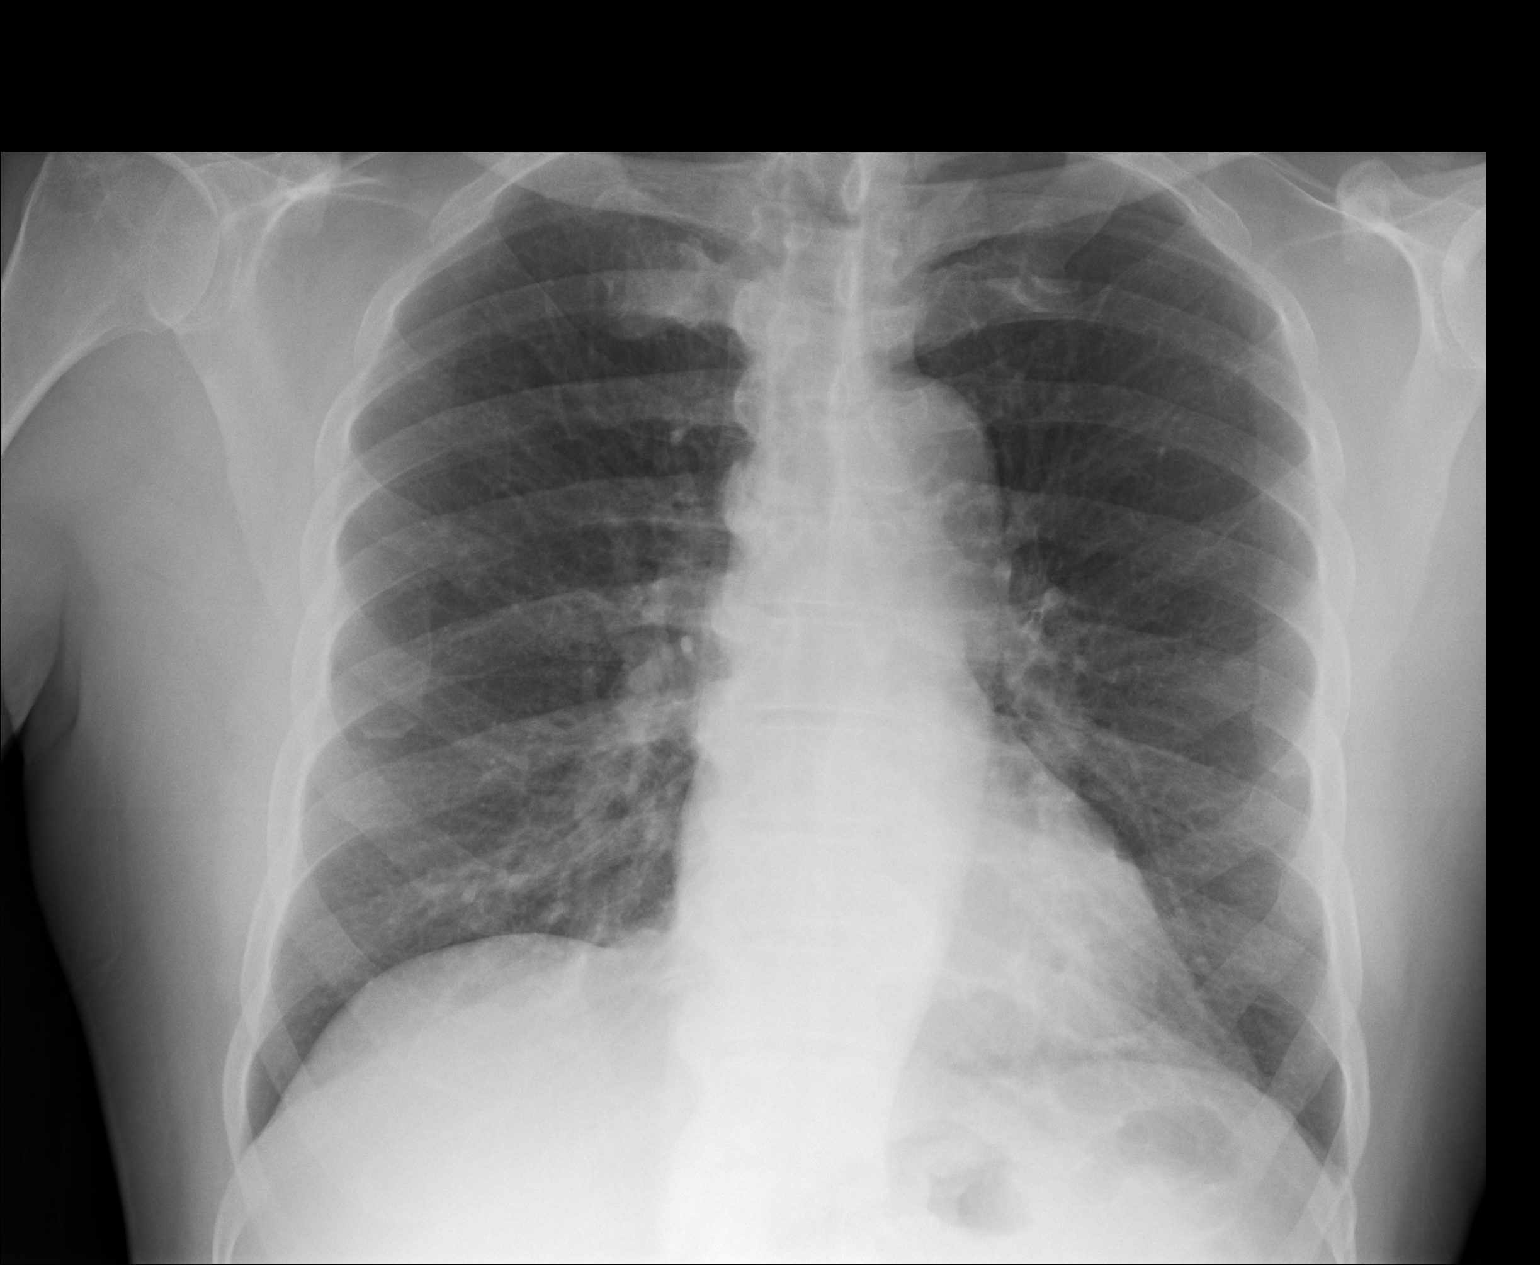
[im 2/2]
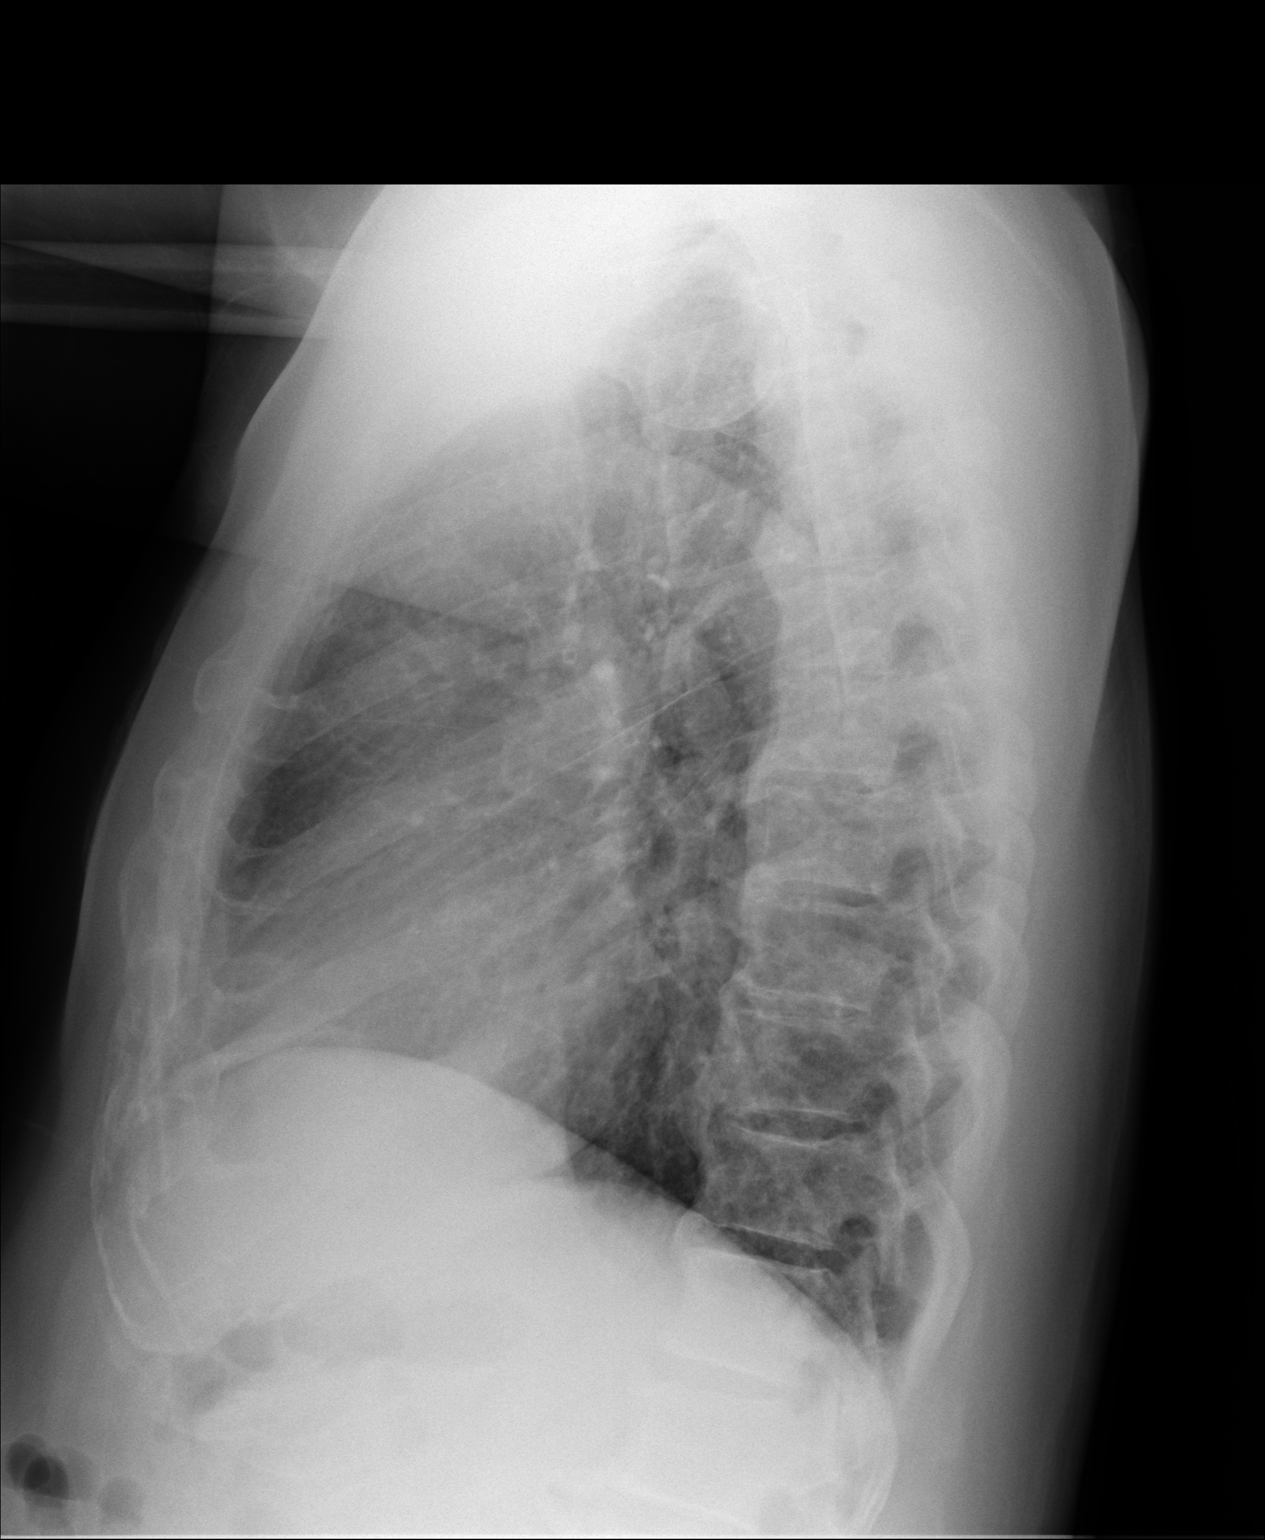

[2 of 2 positions shown; findings below may reference images not displayed]

FINDINGS: The lungs are well-expanded. The interstitial markings are mildly
prominent. There is no alveolar infiltrate. The heart and pulmonary
vascularity are normal. There is tortuosity of the descending
thoracic aorta. The bony thorax exhibits no acute abnormality. There
is calcification of the anterior longitudinal ligament of the
thoracic spine.
IMPRESSION: Coarse lung markings bilaterally which likely reflects the patient's
smoking history as well as chronic bronchitic change. There is no
pneumonia nor other acute cardiopulmonary abnormality.

## 2017-04-12 ENCOUNTER — Ambulatory Visit: Payer: Commercial Managed Care - HMO | Admitting: Family Medicine

## 2017-04-15 ENCOUNTER — Encounter: Payer: Self-pay | Admitting: Family Medicine

## 2017-04-15 ENCOUNTER — Ambulatory Visit (INDEPENDENT_AMBULATORY_CARE_PROVIDER_SITE_OTHER): Payer: Commercial Managed Care - HMO | Admitting: Family Medicine

## 2017-04-15 VITALS — BP 138/74 | HR 106 | Temp 98.0°F | Resp 16 | Ht 64.0 in | Wt 159.0 lb

## 2017-04-15 DIAGNOSIS — M79641 Pain in right hand: Secondary | ICD-10-CM

## 2017-04-15 DIAGNOSIS — M1811 Unilateral primary osteoarthritis of first carpometacarpal joint, right hand: Secondary | ICD-10-CM | POA: Diagnosis not present

## 2017-04-15 DIAGNOSIS — S50312A Abrasion of left elbow, initial encounter: Secondary | ICD-10-CM

## 2017-04-15 DIAGNOSIS — M79642 Pain in left hand: Secondary | ICD-10-CM | POA: Diagnosis not present

## 2017-04-15 DIAGNOSIS — M1812 Unilateral primary osteoarthritis of first carpometacarpal joint, left hand: Secondary | ICD-10-CM | POA: Diagnosis not present

## 2017-04-15 DIAGNOSIS — T148XXA Other injury of unspecified body region, initial encounter: Secondary | ICD-10-CM | POA: Diagnosis not present

## 2017-04-15 DIAGNOSIS — M180 Bilateral primary osteoarthritis of first carpometacarpal joints: Secondary | ICD-10-CM

## 2017-04-15 DIAGNOSIS — I10 Essential (primary) hypertension: Secondary | ICD-10-CM | POA: Diagnosis not present

## 2017-04-15 MED ORDER — LOSARTAN POTASSIUM 50 MG PO TABS: 50.0000 mg | ORAL_TABLET | Freq: Every day | ORAL | 11 refills | Status: AC

## 2017-04-15 MED ORDER — DICLOFENAC SODIUM 1 % TD GEL: 2.0000 g | Freq: Three times a day (TID) | TRANSDERMAL | 2 refills | Status: AC | PRN

## 2017-04-15 NOTE — Patient Instructions (Addendum)
Thank you for coming to the clinic today.  1. Start back on Losartan 50mg  daily for BP - Check BP at pharmacy 1 x monthly when you get your medicine refills - If BP >150/90 persistently then notify office and we can adjust medication  2. For Thumb symptoms, refilled Diclofenac gel, use as needed If not improving will need referral back to Orthopedics  Please schedule a Follow-up Appointment to: Return in about 3 months (around 07/16/2017) for blood pressure.  If you have any other questions or concerns, please feel free to call the clinic or send a message through MyChart. You may also schedule an earlier appointment if necessary.  Additionally, you may be receiving a survey about your experience at our clinic within a few days to 1 week by e-mail or mail. We value your feedback.  Saralyn PilarAlexander Carlie Solorzano, DO [facility], [place]
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
## 2017-06-18 ENCOUNTER — Ambulatory Visit (INDEPENDENT_AMBULATORY_CARE_PROVIDER_SITE_OTHER): Payer: Medicare HMO | Admitting: Family Medicine

## 2017-06-18 ENCOUNTER — Encounter: Payer: Self-pay | Admitting: Family Medicine

## 2017-06-18 VITALS — BP 118/62 | HR 104 | Temp 98.0°F | Resp 16 | Ht 64.0 in | Wt 167.0 lb

## 2017-06-18 DIAGNOSIS — J441 Chronic obstructive pulmonary disease with (acute) exacerbation: Secondary | ICD-10-CM

## 2017-06-18 DIAGNOSIS — J449 Chronic obstructive pulmonary disease, unspecified: Secondary | ICD-10-CM | POA: Insufficient documentation

## 2017-06-18 MED ORDER — ALBUTEROL SULFATE HFA 108 (90 BASE) MCG/ACT IN AERS: 2.0000 | INHALATION_SPRAY | Freq: Four times a day (QID) | RESPIRATORY_TRACT | 3 refills | Status: AC | PRN

## 2017-06-18 MED ORDER — IPRATROPIUM-ALBUTEROL 0.5-2.5 (3) MG/3ML IN SOLN: 3.0000 mL | Freq: Once | RESPIRATORY_TRACT | Status: DC

## 2017-06-18 MED ORDER — LEVOFLOXACIN 500 MG PO TABS: 500.0000 mg | ORAL_TABLET | Freq: Every day | ORAL | 0 refills | Status: DC

## 2017-06-18 MED ORDER — PREDNISONE 10 MG PO TABS: 40.0000 mg | ORAL_TABLET | Freq: Every day | ORAL | 0 refills | Status: DC

## 2017-06-18 NOTE — Progress Notes (Signed)
Subjective:    Patient ID: Roy Roberson, male    DOB: 10-24-1945, 71 y.o.   MRN: [id number]  Roy Roberson is a 71 y.o. male presenting on 06/18/2017 for Shortness of Breath (onset week wheezing no fever or chills coughing up phlem and can't breath as per patient)  Patient presents for a same day appointment.  HPI   PRESUMED ACUTE COPD EXACERBATION / SHORTNESS OF BREATH / COUGHING - Patient reports new concern with 1 week worsening dyspnea and productive cough sputum with wheezing, difficulty breathing worse at night especially if laying down. - He is a former smoker, reportedly quit 3 weeks ago but had been smoking < 1ppd to < 0.5ppd for >30 years, has prior history of COPD but never confirmed with PFTs or treated in past, he does not have an inhaler. - Admits congestion, sore throat - Denies any known sick contacts, fevers, chills, sweats, chest pain or tightness, headache, hemoptysis  Health Maintenance: - Due for Flu Shot, defer to give today since patient is ill with acute COPD  Depression screen [facility] 2/9 04/15/2017 03/28/2016 04/15/2015  Decreased Interest - 0 0  Down, Depressed, Hopeless 0 0 0  PHQ - 2 Score 0 0 0    Social History  Substance Use Topics  . Smoking status: Former Smoker    Packs/day: 0.25    Years: 30.00    Quit date: 05/28/2017  . Smokeless tobacco: Current User     Comment: Quit 05/28/17  . Alcohol use Yes     Comment: 12 pack over the weekend.     Review of Systems Per HPI unless specifically indicated above     Objective:    BP 118/62   Pulse (!) 104   Temp 98 F (36.7 C) (Oral)   Resp 16   Ht  (1.626 m)   Wt 167 lb (75.8 kg)   SpO2 100%   BMI 28.67 kg/m   Wt Readings from Last 3 Encounters:  06/18/17 167 lb (75.8 kg)  04/15/17 159 lb (72.1 kg)  01/08/17 162 lb (73.5 kg)    Physical Exam  Constitutional: He is oriented to person, place, and time. He appears well-developed and well-nourished. No distress.  Chronically ill  appearing, uncomfortable with cough, cooperative  HENT:  Head: Normocephalic and atraumatic.  Mouth/Throat: Oropharynx is clear and moist.  Frontal / maxillary sinuses non-tender. Nares patent with some congestion without purulence or. Bilateral TMs clear without erythema, effusion or bulging. Oropharynx clear without erythema, exudates, edema or asymmetry, poor dentition  Eyes: Conjunctivae are normal. Right eye exhibits no discharge. Left eye exhibits no discharge.  Neck: Normal range of motion.  Cardiovascular: Regular rhythm, normal heart sounds and intact distal pulses.   No murmur heard. Tachycardic  Pulmonary/Chest: Effort normal. No respiratory distress. He has wheezes (Diffuse coarse exp wheezing). He has no rales.  Pre-Nebulizer Treatment Mild to moderate diffuse reduced air movement with tight exp wheezing, no focal crackles but some coarse breath sounds. Speaks full sentences, not labored breathing.  Post-Nebulizer (Duoneb x 1) Treatment Noticeable improvement in air movement and frequent coughing spells with still some exp wheezing and coarse breath sounds but improved.  Musculoskeletal: Normal range of motion. He exhibits no edema.  Neurological: He is alert and oriented to person, place, and time.  Skin: Skin is warm and dry. No rash noted. He is not diaphoretic. No erythema.  Psychiatric: His behavior is normal.  Nursing note and vitals reviewed.  Results for  orders placed or performed during the hospital encounter of 06/16/16  Urine culture  Result Value Ref Range   Specimen Description URINE, CLEAN CATCH    Special Requests Normal    Culture NO GROWTH Performed at [facility]     Report Status 06/18/2016 FINAL   Blood culture (routine x 2)  Result Value Ref Range   Specimen Description BLOOD RIGHT ANTECUBITAL    Special Requests BOTTLES DRAWN AEROBIC AND ANAEROBIC  10CC    Culture NO GROWTH 5 DAYS    Report Status 06/21/2016 FINAL   Blood culture  (routine x 2)  Result Value Ref Range   Specimen Description BLOOD LEFT ANTECUBITAL    Special Requests BOTTLES DRAWN AEROBIC AND ANAEROBIC  10CC    Culture NO GROWTH 5 DAYS    Report Status 06/21/2016 FINAL   CBC with Differential  Result Value Ref Range   WBC 12.5 (H) 3.8 - 10.6 K/uL   RBC 4.88 4.40 - 5.90 MIL/uL   Hemoglobin 15.0 13.0 - 18.0 g/dL   HCT 16.1 09.6 - 04.5 %   MCV 91.6 80.0 - 100.0 fL   MCH 30.7 26.0 - 34.0 pg   MCHC 33.5 32.0 - 36.0 g/dL   RDW 40.9 (H) 81.1 - 91.4 %   Platelets 167 150 - 440 K/uL   Neutrophils Relative % 79 %   Neutro Abs 9.7 (H) 1.4 - 6.5 K/uL   Lymphocytes Relative 14 %   Lymphs Abs 1.7 1.0 - 3.6 K/uL   Monocytes Relative 7 %   Monocytes Absolute 0.9 0.2 - 1.0 K/uL   Eosinophils Relative 0 %   Eosinophils Absolute 0.0 0 - 0.7 K/uL   Basophils Relative 0 %   Basophils Absolute 0.0 0 - 0.1 K/uL  Comprehensive metabolic panel  Result Value Ref Range   Sodium 134 (L) 135 - 145 mmol/L   Potassium 3.3 (L) 3.5 - 5.1 mmol/L   Chloride 100 (L) 101 - 111 mmol/L   CO2 27 22 - 32 mmol/L   Glucose, Bld 123 (H) 65 - 99 mg/dL   BUN 16 6 - 20 mg/dL   Creatinine, Ser 7.82 0.61 - 1.24 mg/dL   Calcium 8.6 (L) 8.9 - 10.3 mg/dL   Total Protein 7.9 6.5 - 8.1 g/dL   Albumin 3.3 (L) 3.5 - 5.0 g/dL   AST 37 15 - 41 U/L   ALT 27 17 - 63 U/L   Alkaline Phosphatase 78 38 - 126 U/L   Total Bilirubin 1.8 (H) 0.3 - 1.2 mg/dL   GFR calc non Af Amer >60 >60 mL/min   GFR calc Af Amer >60 >60 mL/min   Anion gap 7 5 - 15  Urinalysis complete, with microscopic ([facility] only)  Result Value Ref Range   Color, Urine AMBER (A) YELLOW   APPearance CLEAR (A) CLEAR   Glucose, UA NEGATIVE NEGATIVE mg/dL   Bilirubin Urine 1+ (A) NEGATIVE   Ketones, ur NEGATIVE NEGATIVE mg/dL   Specific Gravity, Urine >1.060 (H) 1.005 - 1.030   Hgb urine dipstick NEGATIVE NEGATIVE   pH 6.0 5.0 - 8.0   Protein, ur 30 (A) NEGATIVE mg/dL   Nitrite NEGATIVE NEGATIVE   Leukocytes, UA NEGATIVE  NEGATIVE   RBC / HPF 0-5 0 - 5 RBC/hpf   WBC, UA 0-5 0 - 5 WBC/hpf   Bacteria, UA NONE SEEN NONE SEEN   Squamous Epithelial / LPF NONE SEEN NONE SEEN   Mucus PRESENT  Assessment & Plan:   Problem List Items Addressed This Visit    Chronic obstructive pulmonary disease with acute exacerbation (HCC) - Primary    Consistent with mild-moderate acute exacerbation of COPD with worsening productive cough - No formal dx COPD, however history tobacco abuse and clinical present is consistent now - No hypoxia (100% on RA), afebrile, no recent hospitalization - Inadequately treated at home, no meds  Plan: 1. Duoneb in office today - with notable clinical improvement on exam and by patient report 2. Start Prednisone  tabs x 4 per dose  daily for 5 days burst 3. Start Levaquin  daily x7 days for COPD 4. New rx albuterol q 4 hr regularly x 3 days - demonstrated use in office and advised to have pharmacist demonstrate first use as well - Discussed may need other inhalers in future if confirm COPD for maintenance. Or if albuterol inhalers not covered, may be able to get him a nebulizer machine, he was given tubing today just incase he needs it in future 5. RTC about 1 week if not improving, otherwise strict return criteria to go to ED      Relevant Medications   ipratropium-albuterol (DUONEB) 0.5-2.5 (3) MG/3ML nebulizer solution 3 mL   albuterol (PROVENTIL HFA;VENTOLIN HFA) 108 (90 Base) MCG/ACT inhaler   predniSONE (DELTASONE) 10 MG tablet   levofloxacin (LEVAQUIN) 500 MG tablet      Meds ordered this encounter  Medications  . ipratropium-albuterol (DUONEB) 0.5-2.5 (3) MG/3ML nebulizer solution 3 mL  . albuterol (PROVENTIL HFA;VENTOLIN HFA) 108 (90 Base) MCG/ACT inhaler    Sig: Inhale 2 puffs into the lungs every 6 (six) hours as needed for wheezing or shortness of breath.    Dispense:  1 Inhaler    Refill:  3  . predniSONE (DELTASONE) 10 MG tablet    Sig: Take 4 tablets  (40 mg total) by mouth daily with breakfast. For 5 days    Dispense:  20 tablet    Refill:  0  . levofloxacin (LEVAQUIN) 500 MG tablet    Sig: Take 1 tablet (500 mg total) by mouth daily. For 7 days    Dispense:  7 tablet    Refill:  0    Follow up plan: Return in about 4 weeks (around 07/16/2017) for HTN, COPD.  Saralyn Pilar, DO [facility] 06/18/2017, 6:11 PM

## 2017-06-18 NOTE — Patient Instructions (Addendum)
Thank you for coming to the clinic today.  1.  It sounds like you had an Upper Respiratory Virus that has settled into a Bronchitis, lower respiratory tract infection. I don't have concerns for pneumonia today, and think that this should gradually improve. Once you are feeling better, the cough may take a few weeks to fully resolve. I do hear wheezing and coarse breath sounds, this may be due to the virus, also could be related to smoking.  Most likely COPD flare up  - Start Prednisone  daily for next 5 days - this will open up lungs allow you to breath better and treat that wheezing or bronchospasm  Start antibiotic Levaquin  once daily for 7 days  - Use Albuterol inhaler 2 puffs every 4-6 hours around the clock for next 2-3 days, max up to 5 days then use as needed (ask pharmacist to demonstrate proper inhaler use)  - Start OTC Mucinex for 1 week or less, to help clear the mucus  - Drink plenty of fluids to improve congestion  If your symptoms seem to worsen instead of improve over next several days, including significant fever / chills, worsening shortness of breath, worsening wheezing, or nausea / vomiting and can't take medicines - return sooner or go to hospital Emergency Department for more immediate treatment.  Please schedule a Follow-up Appointment to: Return in about 4 weeks (around 07/16/2017) for HTN, COPD.  If you have any other questions or concerns, please feel free to call the clinic or send a message through MyChart. You may also schedule an earlier appointment if necessary.  Additionally, you may be receiving a survey about your experience at our clinic within a few days to 1 week by e-mail or mail. We value your feedback.  Saralyn Pilar, DO [facility], [place]

## 2017-06-18 NOTE — Assessment & Plan Note (Signed)
Consistent with mild-moderate acute exacerbation of COPD with worsening productive cough - No formal dx COPD, however history tobacco abuse and clinical present is consistent now - No hypoxia (100% on RA), afebrile, no recent hospitalization - Inadequately treated at home, no meds  Plan: 1. Duoneb in office today - with notable clinical improvement on exam and by patient report 2. Start Prednisone  tabs x 4 per dose  daily for 5 days burst 3. Start Levaquin  daily x7 days for COPD 4. New rx albuterol q 4 hr regularly x 3 days - demonstrated use in office and advised to have pharmacist demonstrate first use as well - Discussed may need other inhalers in future if confirm COPD for maintenance. Or if albuterol inhalers not covered, may be able to get him a nebulizer machine, he was given tubing today just incase he needs it in future 5. RTC about 1 week if not improving, otherwise strict return criteria to go to ED

## 2017-06-28 ENCOUNTER — Ambulatory Visit (INDEPENDENT_AMBULATORY_CARE_PROVIDER_SITE_OTHER): Payer: Medicare HMO | Admitting: Family Medicine

## 2017-06-28 ENCOUNTER — Encounter: Payer: Self-pay | Admitting: Family Medicine

## 2017-06-28 VITALS — BP 106/57 | HR 110 | Temp 98.0°F | Resp 16 | Ht 64.0 in | Wt 176.0 lb

## 2017-06-28 DIAGNOSIS — J441 Chronic obstructive pulmonary disease with (acute) exacerbation: Secondary | ICD-10-CM

## 2017-06-28 MED ORDER — PREDNISONE 10 MG PO TABS: ORAL_TABLET | ORAL | 0 refills | Status: DC

## 2017-06-28 MED ORDER — UMECLIDINIUM-VILANTEROL 62.5-25 MCG/INH IN AEPB: 1.0000 | INHALATION_SPRAY | Freq: Every day | RESPIRATORY_TRACT | 2 refills | Status: DC

## 2017-06-28 NOTE — Progress Notes (Signed)
Subjective:    Patient ID: Roy Roberson, male    DOB: 17-Jul-1946, 71 y.o.   MRN: [id number]  Roy Roberson is a 71 y.o. male presenting on 06/28/2017 for Shortness of Breath (finished antibiotics course it was helping but now Sx is coming back)   HPI   ACUTE COPD EXACERBATION / SHORTNESS OF BREATH / COUGHING - Last visit with me 06/18/17, for initial visit for same problem concern presume COPD exac without prior dx, treated with Levaquin 500mg  daily x 7 days, Prednisone 40mg  daily x 5 days, and Albuterol inhaler, see prior notes for background information. - Interval update with significant improvement in breathing, coughing improved, his breathing was still not back to baseline - Today patient reports now worsening again over past 2-3 days and breathing is worse again, wheezing is back worse at night, thicker productive sputum, and cough. - Using Albuterol inhaler 2-3 times daily, most needed at night - Tried Mucinex OTC with improvement and then worse again off it - Never had PFTs or established with Pulmonology - He is a former smoker, reportedly quit 3 weeks ago but had been smoking < 1ppd to < 0.5ppd for >30 years - He is asking about maintenance inhalers he has seen on TV ad - Admits some worsening dyspnea with exertion at times now with wheezing - Denies active chest pain except some central discomfort with coughing "nagging" pain, denies chest pressure, nausea vomiting, fever/chills, hemoptysis  Health Maintenance: - Due for Flu Shot, defer to give today since patient is ill with acute COPD  Depression screen [facility] 2/9 04/15/2017 03/28/2016 04/15/2015  Decreased Interest - 0 0  Down, Depressed, Hopeless 0 0 0  PHQ - 2 Score 0 0 0    Social History  Substance Use Topics  . Smoking status: Former Smoker    Packs/day: 0.25    Years: 30.00    Quit date: 05/28/2017  . Smokeless tobacco: Current User     Comment: Quit 05/28/17  . Alcohol use Yes     Comment: 12 pack over the  weekend.     Review of Systems Per HPI unless specifically indicated above     Objective:    BP (!) 106/57   Pulse (!) 110   Temp 98 F (36.7 C) (Oral)   Resp 16   Ht 5\' 4"  (1.626 m)   Wt 176 lb (79.8 kg)   SpO2 97%   BMI 30.21 kg/m   Wt Readings from Last 3 Encounters:  06/28/17 176 lb (79.8 kg)  06/18/17 167 lb (75.8 kg)  04/15/17 159 lb (72.1 kg)    Physical Exam  Constitutional: He is oriented to person, place, and time. He appears well-developed and well-nourished. No distress.  Mildly improved but still uncomfortable with cough, cooperative  HENT:  Head: Normocephalic and atraumatic.  Mouth/Throat: Oropharynx is clear and moist.  Frontal / maxillary sinuses non-tender. Nares patent with some congestion without purulence or. Bilateral TMs clear without erythema, effusion or bulging. Oropharynx clear without erythema, exudates, edema or asymmetry, poor dentition  Eyes: Conjunctivae are normal. Right eye exhibits no discharge. Left eye exhibits no discharge.  Neck: Normal range of motion.  Cardiovascular: Regular rhythm, normal heart sounds and intact distal pulses.   No murmur heard. Tachycardic  Pulmonary/Chest: Effort normal. No respiratory distress. He has wheezes. He has no rales.  Mild to moderate diffuse reduced air movement with tight exp wheezing, no focal crackles but some coarse breath sounds. Speaks full sentences,  not labored breathing. Some coughing. Seems improved from last visit.  Musculoskeletal: Normal range of motion. He exhibits no edema.  Neurological: He is alert and oriented to person, place, and time.  Skin: Skin is warm and dry. No rash noted. He is not diaphoretic. No erythema.  Psychiatric: His behavior is normal.  Nursing note and vitals reviewed.  Results for orders placed or performed during the hospital encounter of 06/16/16  Urine culture  Result Value Ref Range   Specimen Description URINE, CLEAN CATCH    Special Requests Normal     Culture NO GROWTH Performed at [facility]     Report Status 06/18/2016 FINAL   Blood culture (routine x 2)  Result Value Ref Range   Specimen Description BLOOD RIGHT ANTECUBITAL    Special Requests BOTTLES DRAWN AEROBIC AND ANAEROBIC  10CC    Culture NO GROWTH 5 DAYS    Report Status 06/21/2016 FINAL   Blood culture (routine x 2)  Result Value Ref Range   Specimen Description BLOOD LEFT ANTECUBITAL    Special Requests BOTTLES DRAWN AEROBIC AND ANAEROBIC  10CC    Culture NO GROWTH 5 DAYS    Report Status 06/21/2016 FINAL   CBC with Differential  Result Value Ref Range   WBC 12.5 (H) 3.8 - 10.6 K/uL   RBC 4.88 4.40 - 5.90 MIL/uL   Hemoglobin 15.0 13.0 - 18.0 g/dL   HCT 16.1 09.6 - 04.5 %   MCV 91.6 80.0 - 100.0 fL   MCH 30.7 26.0 - 34.0 pg   MCHC 33.5 32.0 - 36.0 g/dL   RDW 40.9 (H) 81.1 - 91.4 %   Platelets 167 150 - 440 K/uL   Neutrophils Relative % 79 %   Neutro Abs 9.7 (H) 1.4 - 6.5 K/uL   Lymphocytes Relative 14 %   Lymphs Abs 1.7 1.0 - 3.6 K/uL   Monocytes Relative 7 %   Monocytes Absolute 0.9 0.2 - 1.0 K/uL   Eosinophils Relative 0 %   Eosinophils Absolute 0.0 0 - 0.7 K/uL   Basophils Relative 0 %   Basophils Absolute 0.0 0 - 0.1 K/uL  Comprehensive metabolic panel  Result Value Ref Range   Sodium 134 (L) 135 - 145 mmol/L   Potassium 3.3 (L) 3.5 - 5.1 mmol/L   Chloride 100 (L) 101 - 111 mmol/L   CO2 27 22 - 32 mmol/L   Glucose, Bld 123 (H) 65 - 99 mg/dL   BUN 16 6 - 20 mg/dL   Creatinine, Ser 7.82 0.61 - 1.24 mg/dL   Calcium 8.6 (L) 8.9 - 10.3 mg/dL   Total Protein 7.9 6.5 - 8.1 g/dL   Albumin 3.3 (L) 3.5 - 5.0 g/dL   AST 37 15 - 41 U/L   ALT 27 17 - 63 U/L   Alkaline Phosphatase 78 38 - 126 U/L   Total Bilirubin 1.8 (H) 0.3 - 1.2 mg/dL   GFR calc non Af Amer >60 >60 mL/min   GFR calc Af Amer >60 >60 mL/min   Anion gap 7 5 - 15  Urinalysis complete, with microscopic ([facility] only)  Result Value Ref Range   Color, Urine AMBER (A) YELLOW    APPearance CLEAR (A) CLEAR   Glucose, UA NEGATIVE NEGATIVE mg/dL   Bilirubin Urine 1+ (A) NEGATIVE   Ketones, ur NEGATIVE NEGATIVE mg/dL   Specific Gravity, Urine >1.060 (H) 1.005 - 1.030   Hgb urine dipstick NEGATIVE NEGATIVE   pH 6.0 5.0 - 8.0  Protein, ur 30 (A) NEGATIVE mg/dL   Nitrite NEGATIVE NEGATIVE   Leukocytes, UA NEGATIVE NEGATIVE   RBC / HPF 0-5 0 - 5 RBC/hpf   WBC, UA 0-5 0 - 5 WBC/hpf   Bacteria, UA NONE SEEN NONE SEEN   Squamous Epithelial / LPF NONE SEEN NONE SEEN   Mucus PRESENT       Assessment & Plan:   Problem List Items Addressed This Visit    Chronic obstructive pulmonary disease with acute exacerbation (HCC) - Primary    Persistent to recurrent COPD exac, had improved on levaquin and prednisone, now recur within 2-3 days off meds. Today consistent with mild-moderate acute exacerbation of COPD with worsening productive cough and dyspnea - No formal dx COPD, however history tobacco abuse - No hypoxia (97% on RA), afebrile, no recent hospitalization  Plan: 1. Offered repeat duoneb in office - declined today 2. Repeat trial on prednisone, this time use 6 day taper dosepak 60 to 10mg  3. Finished with antibiotics for now s/p levaquin - no repeat 4. New rx Anoro dual therapy inhaler - patient requested, sent to pharmacy, checked ins should be on preferred list 5. Continue Albuterol q 4 hour 2 puffs regularly for next 3 days then PRN 6. Referral to [facility] for further COPD work-up and management, recommended PFTs and pulse oximetry testing 6 min walk may need overnight oxygen, and would benefit from Pulm eval, also may have better access to inhalers via samples etc 7. Follow-up if not improved, otherwise go to Pulm first, otherwise strict return criteria to go to ED      Relevant Medications   predniSONE (DELTASONE) 10 MG tablet   umeclidinium-vilanterol (ANORO ELLIPTA) 62.5-25 MCG/INH AEPB   Other Relevant Orders   Ambulatory referral to Pulmonology       Meds ordered this encounter  Medications  . predniSONE (DELTASONE) 10 MG tablet    Sig: Take 6 tabs with breakfast Day 1, 5 tabs Day 2, 4 tabs Day 3, 3 tabs Day 4, 2 tabs Day 5, 1 tab Day 6.    Dispense:  21 tablet    Refill:  0  . umeclidinium-vilanterol (ANORO ELLIPTA) 62.5-25 MCG/INH AEPB    Sig: Inhale 1 puff into the lungs daily.    Dispense:  30 each    Refill:  2    Follow up plan: Return in about 2 weeks (around 07/12/2017), or if symptoms worsen or fail to improve, for COPD.  Saralyn PilarAlexander Karamalegos, DO [facility] 06/29/2017, 8:46 AM

## 2017-06-28 NOTE — Patient Instructions (Addendum)
Thank you for coming to the clinic today.  1.  It sounds like you had an Upper Respiratory Virus that has settled into a Bronchitis, lower respiratory tract infection. I don't have concerns for pneumonia today, and think that this should gradually improve. Once you are feeling better, the cough may take a few weeks to fully resolve. I do hear wheezing and coarse breath sounds, this may be due to the virus, also could be related to smoking.  Most likely COPD flare up - has returned, it seems better but now it is mostly the COPD with wheezing  We will try Prednisone taper now to extend your therapy since it was helping  - Start Prednisone taper from 60mg  (6 pills) down to 1 pill over 6 days  NEW inhaler - Anoro - ONCE daily every day  - Use Albuterol inhaler 2 puffs every 4-6 hours around the clock for next 2-3 days, max up to 5 days then use as needed   - Drink plenty of fluids to improve congestion  REFERRAL TO LUNG DOCTORS ----------------- [facility] [address], [address] [place], [place] [id number] Phone: [phone]  If your symptoms seem to worsen instead of improve over next several days, including significant fever / chills, worsening shortness of breath, worsening wheezing, or nausea / vomiting and can't take medicines - return sooner or go to hospital Emergency Department for more immediate treatment.  Please schedule a Follow-up Appointment to: Return in about 2 weeks (around 07/12/2017), or if symptoms worsen or fail to improve, for COPD.  If you have any other questions or concerns, please feel free to call the clinic or send a message through MyChart. You may also schedule an earlier appointment if necessary.  Additionally, you may be receiving a survey about your experience at our clinic within a few days to 1 week by e-mail or mail. We value your feedback.  Saralyn PilarAlexander Karamalegos, DO [facility]

## 2017-06-29 NOTE — Assessment & Plan Note (Addendum)
Persistent to recurrent COPD exac, had improved on levaquin and prednisone, now recur within 2-3 days off meds. Today consistent with mild-moderate acute exacerbation of COPD with worsening productive cough and dyspnea - No formal dx COPD, however history tobacco abuse - No hypoxia (97% on RA), afebrile, no recent hospitalization  Plan: 1. Offered repeat duoneb in office - declined today 2. Repeat trial on prednisone, this time use 6 day taper dosepak 60 to 10mg  3. Finished with antibiotics for now s/p levaquin - no repeat 4. New rx Anoro dual therapy inhaler - patient requested, sent to pharmacy, checked ins should be on preferred list 5. Continue Albuterol q 4 hour 2 puffs regularly for next 3 days then PRN 6. Referral to [facility] for further COPD work-up and management, recommended PFTs and pulse oximetry testing 6 min walk may need overnight oxygen, and would benefit from Pulm eval, also may have better access to inhalers via samples etc 7. Follow-up if not improved, otherwise go to Pulm first, otherwise strict return criteria to go to ED

## 2017-07-16 ENCOUNTER — Encounter: Payer: Self-pay | Admitting: Family Medicine

## 2017-07-16 ENCOUNTER — Ambulatory Visit (INDEPENDENT_AMBULATORY_CARE_PROVIDER_SITE_OTHER): Payer: Medicare HMO | Admitting: Family Medicine

## 2017-07-16 VITALS — BP 97/62 | HR 104 | Temp 98.4°F | Resp 16 | Ht 64.0 in | Wt 176.0 lb

## 2017-07-16 DIAGNOSIS — I10 Essential (primary) hypertension: Secondary | ICD-10-CM | POA: Diagnosis not present

## 2017-07-16 DIAGNOSIS — Z8619 Personal history of other infectious and parasitic diseases: Secondary | ICD-10-CM

## 2017-07-16 DIAGNOSIS — J432 Centrilobular emphysema: Secondary | ICD-10-CM | POA: Diagnosis not present

## 2017-07-16 DIAGNOSIS — I350 Nonrheumatic aortic (valve) stenosis: Secondary | ICD-10-CM

## 2017-07-16 DIAGNOSIS — Z23 Encounter for immunization: Secondary | ICD-10-CM

## 2017-07-16 MED ORDER — PREDNISONE 10 MG PO TABS: 30.0000 mg | ORAL_TABLET | Freq: Every day | ORAL | 0 refills | Status: AC

## 2017-07-16 NOTE — Progress Notes (Signed)
Subjective:    Patient ID: Roy Roberson, male    DOB: 09/29/1945, 71 y.o.   MRN: [id number]  Roy Roberson is a 71 y.o. male presenting on 07/16/2017 for Hypertension   HPI   CHRONIC HTN: No new concerns. He does not check BP outside office. Current Meds - Losartan 50mg  daily Reports good compliance, did take meds today. Tolerating well, w/o complaints. Lifestyle: - Diet: not following regular diet, not limiting salt still - Exercise: walking but no regular exercise Denies CP, HA, edema, dizziness / lightheadedness  FOLLOW-UP ACUTE COPD EXACERBATION - Last visit with me 06/28/17, for same problem presume COPD exac without prior dx, treated with Levaquin 500mg  daily x 7 days, Prednisone 40mg  daily x 5 days, and Albuterol inhaler and new rx Anoro, see prior notes for background information. - Interval update with significant improvement in breathing, coughing improved. - Today patient reports now has apt with [facility] this Thursday, he is asking for extension on prednisone to last him until that appointment, his breathing started to get a little worse over past few days, describes some dyspnea on exertion and if laying down, with wheezing, coughing productive with sputum - States he was using Anoro inhaler incorrectly and used it up quicker than supposed to, now pharmacy showed him proper use and cannot get refill, asking for sample - Using Albuterol inhaler 2-3 times daily, most needed at night - Off Mucinex - Never had PFTs before - He is a former smoker, reportedly quit 05/2017 but had been smoking < 1ppd to < 0.5ppd for >30 years - Admits some worsening dyspnea with exertion at times now with wheezing - Denies active chest pain or pressure, nausea vomiting, fever/chills, hemoptysis  PMH - Aortic Stenosis, severe, on last ECHO 2016.  Health Maintenance: - Due for Flu Shot, will receive today now COPD exac resolved and afebrile  Depression screen [facility] 2/9 04/15/2017  03/28/2016 04/15/2015  Decreased Interest - 0 0  Down, Depressed, Hopeless 0 0 0  PHQ - 2 Score 0 0 0    Social History   Tobacco Use  . Smoking status: Former Smoker    Packs/day: 0.25    Years: 30.00    Pack years: 7.50    Last attempt to quit: 05/28/2017    Years since quitting: 0.1  . Smokeless tobacco: Current User  . Tobacco comment: Quit 05/28/17  Substance Use Topics  . Alcohol use: Yes    Comment: 12 pack over the weekend.   . Drug use: No    Review of Systems Per HPI unless specifically indicated above     Objective:    BP 97/62   Pulse (!) 104   Temp 98.4 F (36.9 C) (Oral)   Resp 16   Ht 5\' 4"  (1.626 m)   Wt 176 lb (79.8 kg)   SpO2 100%   BMI 30.21 kg/m   Wt Readings from Last 3 Encounters:  07/16/17 176 lb (79.8 kg)  06/28/17 176 lb (79.8 kg)  06/18/17 167 lb (75.8 kg)    Physical Exam  Constitutional: He is oriented to person, place, and time. He appears well-developed and well-nourished. No distress.  Chronically ill but currently well appearing, comfortable, cooperative  HENT:  Head: Normocephalic and atraumatic.  Mouth/Throat: Oropharynx is clear and moist.  Frontal / maxillary sinuses non-tender. Nares patent with some congestion without purulence Oropharynx clear without erythema, exudates, edema or asymmetry, poor dentition  Eyes: Conjunctivae are normal. Right eye exhibits no  discharge. Left eye exhibits no discharge.  Neck: Normal range of motion.  Cardiovascular: Regular rhythm, normal heart sounds and intact distal pulses.  No murmur heard. Tachycardic  Pulmonary/Chest: Effort normal. No respiratory distress. He has no wheezes (Resolved exp wheezes). He has no rales.  Persistent mild-mod diffuse reduced air movement. Resolved exp wheezing. No focal crackles or coarse breath sounds. Speaks full sentences, not labored breathing. Seems improved clinically no coughing  Musculoskeletal: Normal range of motion. He exhibits no edema.    Neurological: He is alert and oriented to person, place, and time.  Skin: Skin is warm and dry. No rash noted. He is not diaphoretic. No erythema.  Psychiatric: His behavior is normal.  Nursing note and vitals reviewed.  Results for orders placed or performed during the hospital encounter of 06/16/16  Urine culture  Result Value Ref Range   Specimen Description URINE, CLEAN CATCH    Special Requests Normal    Culture NO GROWTH Performed at [facility]     Report Status 06/18/2016 FINAL   Blood culture (routine x 2)  Result Value Ref Range   Specimen Description BLOOD RIGHT ANTECUBITAL    Special Requests BOTTLES DRAWN AEROBIC AND ANAEROBIC  10CC    Culture NO GROWTH 5 DAYS    Report Status 06/21/2016 FINAL   Blood culture (routine x 2)  Result Value Ref Range   Specimen Description BLOOD LEFT ANTECUBITAL    Special Requests BOTTLES DRAWN AEROBIC AND ANAEROBIC  10CC    Culture NO GROWTH 5 DAYS    Report Status 06/21/2016 FINAL   CBC with Differential  Result Value Ref Range   WBC 12.5 (H) 3.8 - 10.6 K/uL   RBC 4.88 4.40 - 5.90 MIL/uL   Hemoglobin 15.0 13.0 - 18.0 g/dL   HCT 96.2 95.2 - 84.1 %   MCV 91.6 80.0 - 100.0 fL   MCH 30.7 26.0 - 34.0 pg   MCHC 33.5 32.0 - 36.0 g/dL   RDW 32.4 (H) 40.1 - 02.7 %   Platelets 167 150 - 440 K/uL   Neutrophils Relative % 79 %   Neutro Abs 9.7 (H) 1.4 - 6.5 K/uL   Lymphocytes Relative 14 %   Lymphs Abs 1.7 1.0 - 3.6 K/uL   Monocytes Relative 7 %   Monocytes Absolute 0.9 0.2 - 1.0 K/uL   Eosinophils Relative 0 %   Eosinophils Absolute 0.0 0 - 0.7 K/uL   Basophils Relative 0 %   Basophils Absolute 0.0 0 - 0.1 K/uL  Comprehensive metabolic panel  Result Value Ref Range   Sodium 134 (L) 135 - 145 mmol/L   Potassium 3.3 (L) 3.5 - 5.1 mmol/L   Chloride 100 (L) 101 - 111 mmol/L   CO2 27 22 - 32 mmol/L   Glucose, Bld 123 (H) 65 - 99 mg/dL   BUN 16 6 - 20 mg/dL   Creatinine, Ser 2.53 0.61 - 1.24 mg/dL   Calcium 8.6 (L) 8.9 -  10.3 mg/dL   Total Protein 7.9 6.5 - 8.1 g/dL   Albumin 3.3 (L) 3.5 - 5.0 g/dL   AST 37 15 - 41 U/L   ALT 27 17 - 63 U/L   Alkaline Phosphatase 78 38 - 126 U/L   Total Bilirubin 1.8 (H) 0.3 - 1.2 mg/dL   GFR calc non Af Amer >60 >60 mL/min   GFR calc Af Amer >60 >60 mL/min   Anion gap 7 5 - 15  Urinalysis complete, with microscopic ([facility] only)  Result Value Ref Range   Color, Urine AMBER (A) YELLOW   APPearance CLEAR (A) CLEAR   Glucose, UA NEGATIVE NEGATIVE mg/dL   Bilirubin Urine 1+ (A) NEGATIVE   Ketones, ur NEGATIVE NEGATIVE mg/dL   Specific Gravity, Urine >1.060 (H) 1.005 - 1.030   Hgb urine dipstick NEGATIVE NEGATIVE   pH 6.0 5.0 - 8.0   Protein, ur 30 (A) NEGATIVE mg/dL   Nitrite NEGATIVE NEGATIVE   Leukocytes, UA NEGATIVE NEGATIVE   RBC / HPF 0-5 0 - 5 RBC/hpf   WBC, UA 0-5 0 - 5 WBC/hpf   Bacteria, UA NONE SEEN NONE SEEN   Squamous Epithelial / LPF NONE SEEN NONE SEEN   Mucus PRESENT       Assessment & Plan:   Problem List Items Addressed This Visit    COPD (chronic obstructive pulmonary disease) (HCC) - Primary    Notable improvement seems resolved AECOPD, now some mild worsening again off therapy - New clinical dx COPD recently - No hypoxia (100% on RA), afebrile, no recent hospitalization  Plan: 1. Rx Prednisone 10mg  tabs x dose 30mg  (x 3 tabs) daily for 3 days to last until Pulm apt 2. Hold repeat antibiotics given overall clinical resolution of AECOPD 3. Continue Anoro dual therapy inhaler - not due for refill since used up too early, can check with Pulm about samples or switch to other appropriate maintenance therapy 4. Continue Albuterol q 4 hour 2 puffs regularly for next 3 days then PRN 5. Follow-up with [facility] for further COPD work-up and management, recommended PFTs and pulse oximetry testing 6 min walk may need overnight oxygen, and would benefit from better access to inhalers via samples etc 6. Follow-up if not improved - return 6 weeks       Relevant Medications   predniSONE (DELTASONE) 10 MG tablet   Essential (primary) hypertension    Mildly low BP today - Home BP readings None available    Plan:  1. Continue current med Losartan 50mg  daily - agree with prior holding HCTZ last visit 2. Encourage improved lifestyle - low sodium diet, regular exercise 3. Start monitor BP outside office x1 monthly at pharmacy when fill med, bring readings to next visit, if persistently >150/90 or new symptoms notify office sooner 4. Follow-up q 3 months      History of hepatitis C    Completed therapy with [facility] prior negative Hep C labs      Moderate to severe aortic stenosis    Last ECHO 2016, previously followed by [facility] Mariah Milling Concern this may be contributing to his exertional dyspnea Will proceed with Pulm referral first to evaluate COPD/pulm etiology due to recent recurrence of wheezing, sputum production, coarse breathing improved with COPD treatment, and ultimately if breathing not optimized will return sooner to Cardiology for re-eval repeat ECHO, will follow-up with me in 6 weeks       Other Visit Diagnoses    Needs flu shot       Relevant Orders   Flu vaccine HIGH DOSE PF (Completed)      Meds ordered this encounter  Medications  . predniSONE (DELTASONE) 10 MG tablet    Sig: Take 3 tablets (30 mg total) daily with breakfast by mouth.    Dispense:  9 tablet    Refill:  0    Follow up plan: Return in about 6 weeks (around 08/27/2017) for COPD, follow-up Pulm.  Saralyn Pilar, DO [facility]  07/17/2017, 1:03 AM

## 2017-07-16 NOTE — Patient Instructions (Addendum)
Thank you for coming to the clinic today.  1. BP is good today, keep taking the Losartan, no change today  2. Wrote new rx Prednisone 10mg  - take 3 pills at once with food for 3 days to help open lungs and breathing until can get to lung doctor  3. Lung doctors will help us with sample inhaler most likely and make sure you have what you need with inhalers - They can check X-ray - Also most importantly we need to test your oxygen levels  In future if not improved we can consider ECHO for your heart  Please schedule a Follow-up Appointment to: Return in about 6 weeks (around 08/27/2017) for COPD, follow-up Pulm.  If you have any other questions or concerns, please feel free to call the clinic or send a message through MyChart. You may also schedule an earlier appointment if necessary.  Additionally, you may be receiving a survey about your experience at our clinic within a few days to 1 week by e-mail or mail. We value your feedback.  Saralyn PilarAlexander Karamalegos, DO [facility]
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
## 2017-07-17 NOTE — Assessment & Plan Note (Addendum)
Completed therapy with [facility] prior negative Hep C labs

## 2017-07-17 NOTE — Assessment & Plan Note (Addendum)
Notable improvement seems resolved AECOPD, now some mild worsening again off therapy - New clinical dx COPD recently - No hypoxia (100% on RA), afebrile, no recent hospitalization  Plan: 1. Rx Prednisone 10mg  tabs x dose 30mg  (x 3 tabs) daily for 3 days to last until Pulm apt 2. Hold repeat antibiotics given overall clinical resolution of AECOPD 3. Continue Anoro dual therapy inhaler - not due for refill since used up too early, can check with Pulm about samples or switch to other appropriate maintenance therapy 4. Continue Albuterol q 4 hour 2 puffs regularly for next 3 days then PRN 5. Follow-up with  Pulmonology for further COPD work-up and management, recommended PFTs and pulse oximetry testing 6 min walk may need overnight oxygen, and would benefit from better access to inhalers via samples etc 6. Follow-up if not improved - return 6 weeks

## 2017-07-17 NOTE — Assessment & Plan Note (Addendum)
Last ECHO 2016, previously followed by Cards Dr Mariah MillingGollan Concern this may be contributing to his exertional dyspnea Will proceed with Pulm referral first to evaluate COPD/pulm etiology due to recent recurrence of wheezing, sputum production, coarse breathing improved with COPD treatment, and ultimately if breathing not optimized will return sooner to Cardiology for re-eval repeat ECHO, will follow-up with me in 6 weeks

## 2017-07-17 NOTE — Assessment & Plan Note (Signed)
Mildly low BP today - Home BP readings None available    Plan:  1. Continue current med Losartan 50mg  daily - agree with prior holding HCTZ last visit 2. Encourage improved lifestyle - low sodium diet, regular exercise 3. Start monitor BP outside office x1 monthly at pharmacy when fill med, bring readings to next visit, if persistently >150/90 or new symptoms notify office sooner 4. Follow-up q 3 months

## 2017-07-18 ENCOUNTER — Ambulatory Visit
Admission: RE | Admit: 2017-07-18 | Discharge: 2017-07-18 | Disposition: A | Discharge status: Home / Self Care | Payer: Medicare HMO | Source: Ambulatory Visit | Attending: Internal Medicine | Admitting: Internal Medicine

## 2017-07-18 ENCOUNTER — Encounter: Payer: Self-pay | Admitting: Internal Medicine

## 2017-07-18 ENCOUNTER — Ambulatory Visit: Payer: Medicare HMO | Admitting: Internal Medicine

## 2017-07-18 DIAGNOSIS — J90 Pleural effusion, not elsewhere classified: Secondary | ICD-10-CM | POA: Diagnosis not present

## 2017-07-18 DIAGNOSIS — J441 Chronic obstructive pulmonary disease with (acute) exacerbation: Secondary | ICD-10-CM | POA: Insufficient documentation

## 2017-07-18 DIAGNOSIS — I878 Other specified disorders of veins: Secondary | ICD-10-CM | POA: Diagnosis not present

## 2017-07-18 DIAGNOSIS — I517 Cardiomegaly: Secondary | ICD-10-CM | POA: Insufficient documentation

## 2017-07-18 DIAGNOSIS — R0602 Shortness of breath: Secondary | ICD-10-CM | POA: Diagnosis not present

## 2017-07-18 MED ORDER — UMECLIDINIUM-VILANTEROL 62.5-25 MCG/INH IN AEPB: 1.0000 | INHALATION_SPRAY | Freq: Every day | RESPIRATORY_TRACT | 2 refills | Status: AC

## 2017-07-18 NOTE — Patient Instructions (Signed)
Continue using Anoro once daily. --Quitting smoking is the most important thing that you can do for your health.   --Quitting smoking will have greater affect on your health than any medicine that we can give you.

## 2017-08-06 ENCOUNTER — Telehealth: Payer: Self-pay | Admitting: Family Medicine

## 2017-08-06 NOTE — Telephone Encounter (Signed)
Called pt to sched for AWV with Nurse Health Advisor.  C/b #  [phone] on Skype [email or web address] if you have questions ° °

## 2017-08-07 ENCOUNTER — Encounter: Payer: Self-pay | Admitting: Nurse Practitioner

## 2017-08-07 ENCOUNTER — Telehealth: Payer: Self-pay | Admitting: Nurse Practitioner
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
## 2017-08-08 NOTE — Telephone Encounter (Addendum)
Notified by Wilhelmina McardleLauren Kennedy, AGPCNP-BC to review this message and case. I have reviewed patient's recent chart, and agree with the previous discussion regarding patient's chronic medical conditions that most likely contributed to this patient's death, specifically Chronic Bronchitis (COPD), with history of tobacco abuse, among other secondary health concerns that may have contributed indirectly such as HTN and vascular disease with Moderate-Severe Aortic Stenosis, additionally with presumed CHF with identified cardiomegaly and pulmonary vascular changes as well, more recently identified on chest x-ray within past few weeks.  Saralyn PilarAlexander Karamalegos, DO [facility] 08/08/2017, 12:43 PM

## 2017-08-10 DEATH — deceased

## 2017-08-13 ENCOUNTER — Encounter: Payer: Self-pay | Admitting: Family Medicine

## 2017-08-13 NOTE — Progress Notes (Signed)
Completed and signed death certificate today. Copy made to be scanned into chart for our records.  Saralyn PilarAlexander Karamalegos, DO [facility] 08/13/2017, 1:42 PM

## 2017-08-27 ENCOUNTER — Ambulatory Visit: Payer: Medicare HMO | Admitting: Family Medicine
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
